# Patient Record
Sex: Male | Born: 1965 | Race: Black or African American | Hispanic: No | Marital: Married | Smoking: Current every day smoker
Health system: Southern US, Community
[De-identification: ages and names within clinical notes are randomized; demographics above are authoritative.]

## PROBLEM LIST (undated history)

## (undated) DIAGNOSIS — I10 Essential (primary) hypertension: Secondary | ICD-10-CM

## (undated) DIAGNOSIS — K4090 Unilateral inguinal hernia, without obstruction or gangrene, not specified as recurrent: Secondary | ICD-10-CM

## (undated) HISTORY — PX: COLONOSCOPY: SHX174

---

## 1999-07-22 ENCOUNTER — Encounter: Payer: Self-pay | Admitting: Emergency Medicine

## 1999-07-22 ENCOUNTER — Emergency Department (HOSPITAL_COMMUNITY): Admission: EM | Admit: 1999-07-22 | Discharge: 1999-07-22 | Payer: Self-pay | Admitting: Emergency Medicine

## 2006-02-25 ENCOUNTER — Emergency Department (HOSPITAL_COMMUNITY): Admission: EM | Admit: 2006-02-25 | Discharge: 2006-02-25 | Payer: Self-pay | Admitting: Emergency Medicine

## 2006-04-19 ENCOUNTER — Ambulatory Visit: Payer: Self-pay | Admitting: Gastroenterology

## 2006-06-10 ENCOUNTER — Ambulatory Visit (HOSPITAL_COMMUNITY): Admission: RE | Admit: 2006-06-10 | Discharge: 2006-06-10 | Payer: Self-pay | Admitting: Gastroenterology

## 2006-06-10 ENCOUNTER — Encounter (INDEPENDENT_AMBULATORY_CARE_PROVIDER_SITE_OTHER): Payer: Self-pay | Admitting: Specialist

## 2006-10-27 ENCOUNTER — Ambulatory Visit: Payer: Self-pay | Admitting: Gastroenterology

## 2007-12-17 IMAGING — CT CT CHEST W/O CM
2 of 9 series · 13 of 38 positions shown, 16 images · IV contrast (CONTRAST)
Comparison: NONE

CLINICAL DATA: Chronic cough. 

CT CHEST WITH INTRAVENOUS CONTRAST
TECHNIQUE: Multiple axial slices were obtained from the lung 
apex through the upper abdomen.  75 cc of Optiray 350 was injected 
at a rate of 3 cc per second.  Lung, soft tissue, and bone window 
settings were obtained.

[Series 18: arterial 2x2 · axial · arterial · 0.68mm/px · z∈[+1313,+1585]mm · 12 of 164 slices shown, 15 images]
[im 14/164  mediastinal]
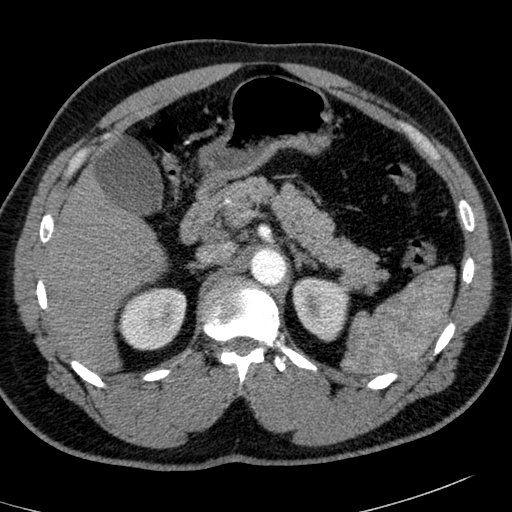
[im 14/164  lung]
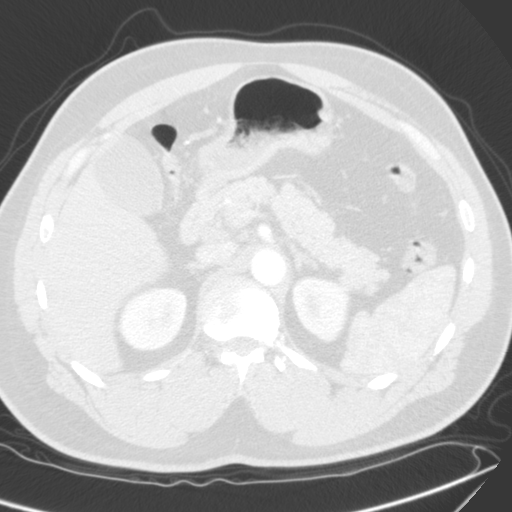
[im 28/164  lung]
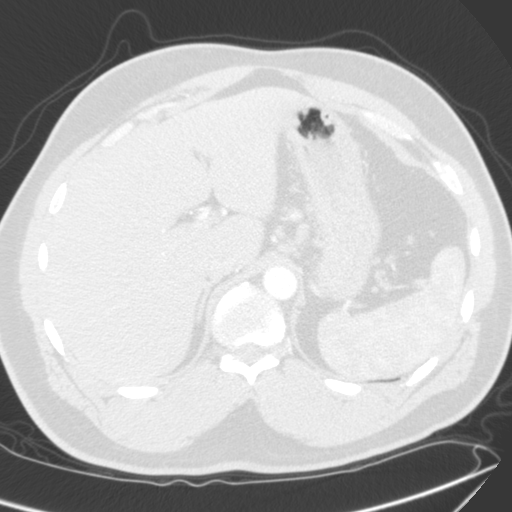
[im 41/164  lung]
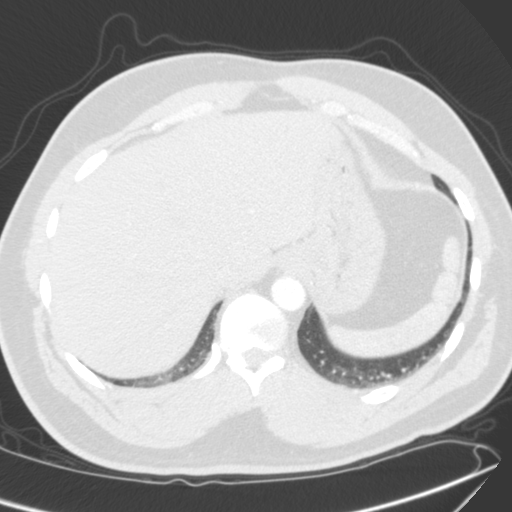
[im 55/164  lung]
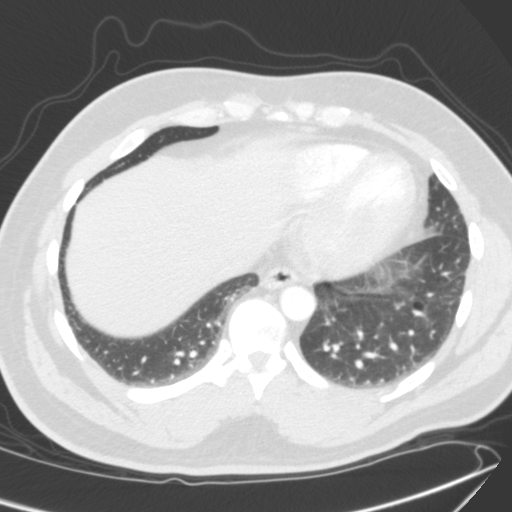
[im 68/164  mediastinal]
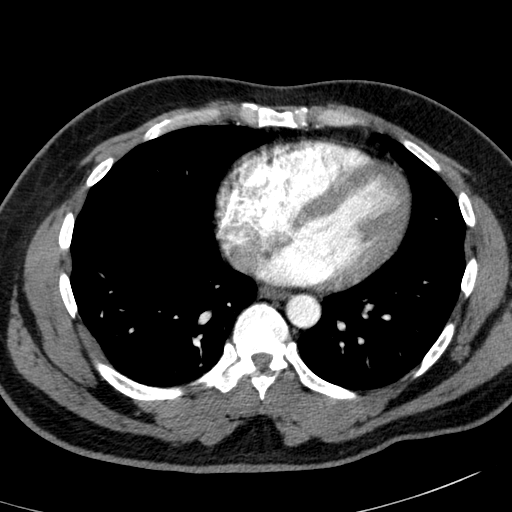
[im 68/164  lung]
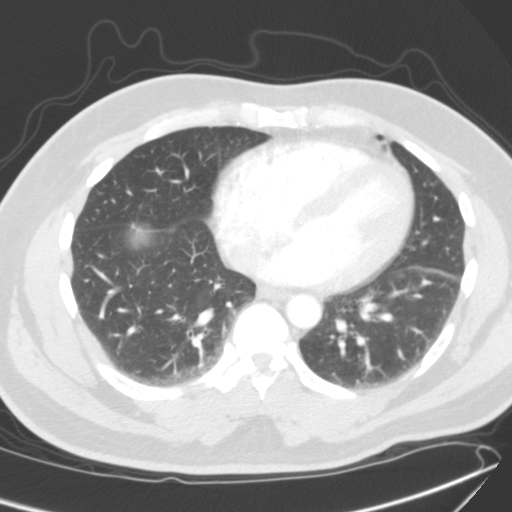
[im 81/164  lung]
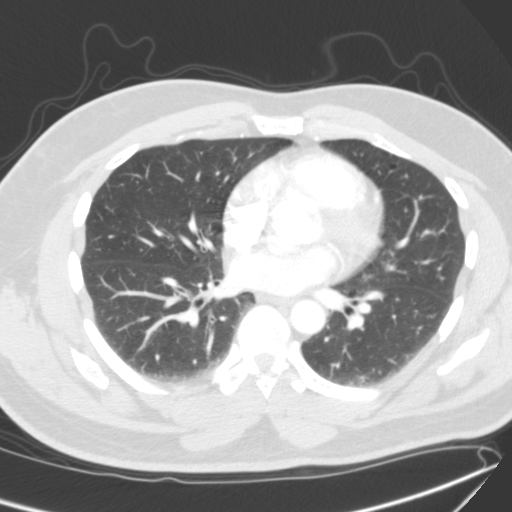
[im 82/164  lung]
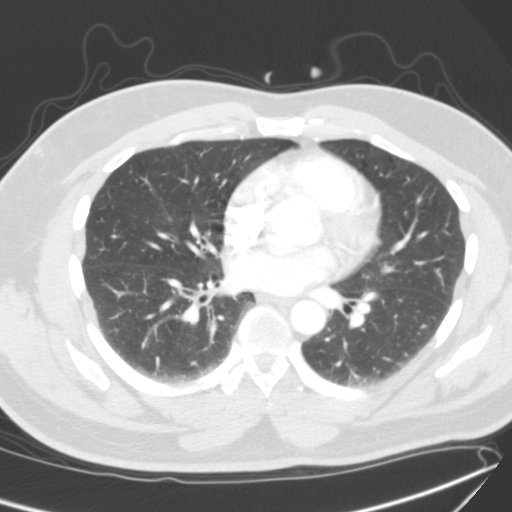
[im 96/164  lung]
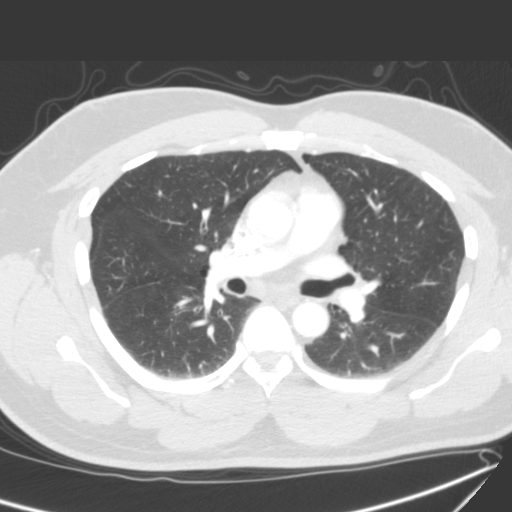
[im 109/164  mediastinal]
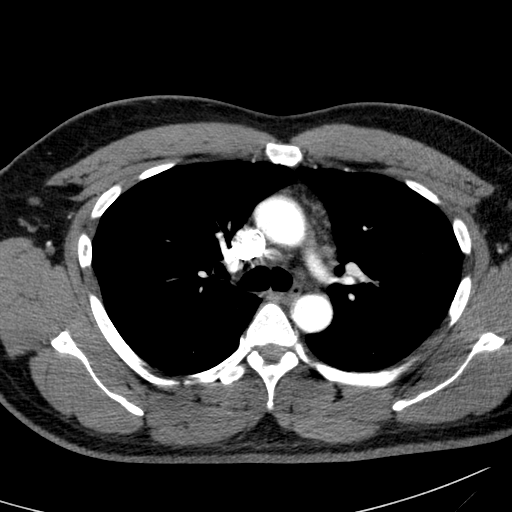
[im 109/164  lung]
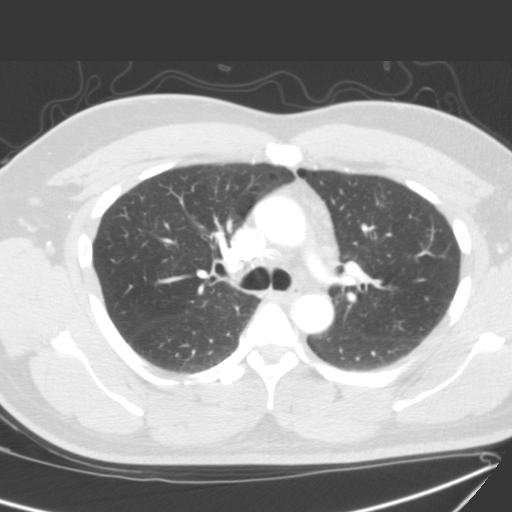
[im 123/164  lung]
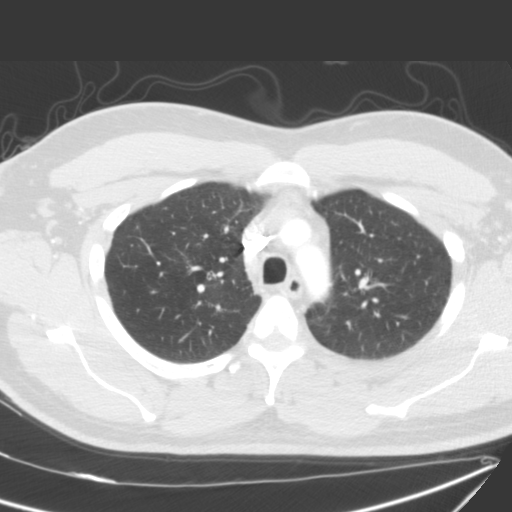
[im 136/164  lung]
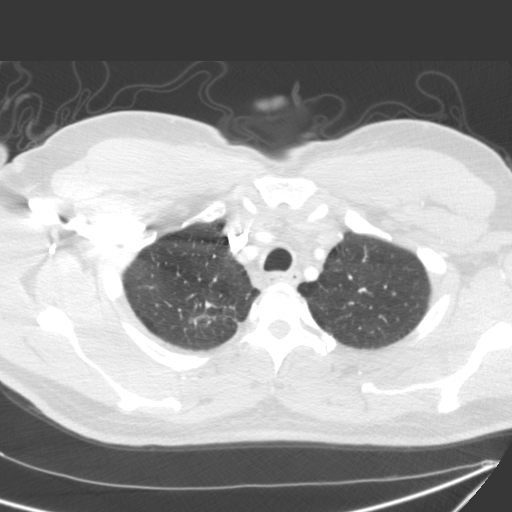
[im 150/164  lung]
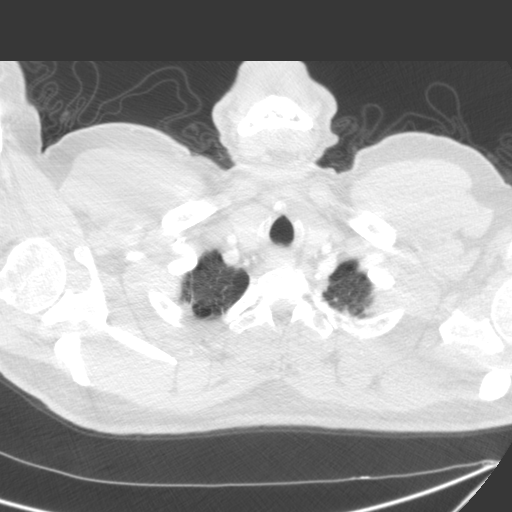

[rpa · coronal · 0.68mm/px · 1 of 121 slices shown]
[im 61/121  lung]
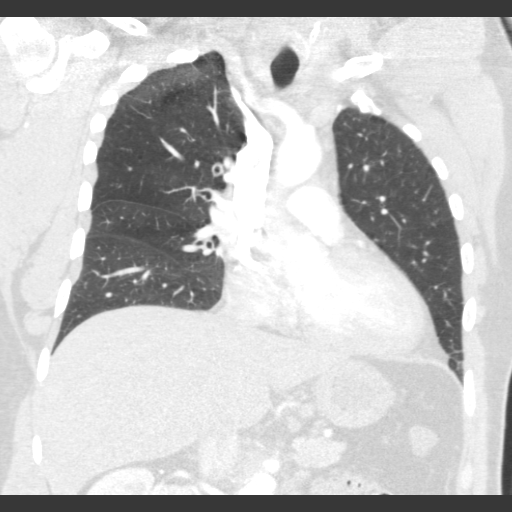

[13 of 38 positions shown; findings below may reference images not displayed]

FINDINGS: The heart size appears to be within normal limits.  
There is an enlarged node seen in the aortopulmonary window region 
that measures 1.3 cm in length.  There is also an enlarged node in 
the precarinal area that measures 1.6 cm in length.  There is 
fullness in the subcarinal region, but no discrete nodes.  No 
pleural effusions or pneumothorax.  There is a bulla seen in the 
left lower lobe.  No focal lytic or sclerotic osseous 
abnormalities.  No evidence of pulmonary masses.
IMPRESSION: Nonspecific mediastinal lymphadenopathy. No pulmonary 
04/15/2006 Dict Date: 04/15/2006  Tran Date: 04/15/2006 DAS  [id number]

## 2010-04-19 ENCOUNTER — Encounter: Payer: Self-pay | Admitting: Gastroenterology

## 2013-12-25 ENCOUNTER — Emergency Department (HOSPITAL_COMMUNITY): Payer: PRIVATE HEALTH INSURANCE

## 2013-12-25 ENCOUNTER — Encounter (HOSPITAL_COMMUNITY): Payer: Self-pay | Admitting: Emergency Medicine

## 2013-12-25 ENCOUNTER — Emergency Department (HOSPITAL_COMMUNITY)
Admission: EM | Admit: 2013-12-25 | Discharge: 2013-12-25 | Discharge status: Against Medical Advice | Payer: PRIVATE HEALTH INSURANCE | Attending: Emergency Medicine | Admitting: Emergency Medicine

## 2013-12-25 DIAGNOSIS — R42 Dizziness and giddiness: Secondary | ICD-10-CM | POA: Insufficient documentation

## 2013-12-25 DIAGNOSIS — R059 Cough, unspecified: Secondary | ICD-10-CM | POA: Diagnosis not present

## 2013-12-25 DIAGNOSIS — I10 Essential (primary) hypertension: Secondary | ICD-10-CM | POA: Diagnosis not present

## 2013-12-25 DIAGNOSIS — H9209 Otalgia, unspecified ear: Secondary | ICD-10-CM | POA: Insufficient documentation

## 2013-12-25 DIAGNOSIS — F17200 Nicotine dependence, unspecified, uncomplicated: Secondary | ICD-10-CM | POA: Diagnosis not present

## 2013-12-25 DIAGNOSIS — R05 Cough: Secondary | ICD-10-CM | POA: Diagnosis not present

## 2013-12-25 HISTORY — DX: Essential (primary) hypertension: I10

## 2013-12-25 NOTE — ED Notes (Signed)
Pt c/o left ear pain and some dizziness with sudden movement; pt sts cough x weeks

## 2014-02-07 ENCOUNTER — Ambulatory Visit (HOSPITAL_BASED_OUTPATIENT_CLINIC_OR_DEPARTMENT_OTHER): Payer: PRIVATE HEALTH INSURANCE

## 2014-04-24 ENCOUNTER — Encounter (HOSPITAL_COMMUNITY): Payer: Self-pay | Admitting: *Deleted

## 2014-04-24 ENCOUNTER — Emergency Department (HOSPITAL_COMMUNITY): Payer: PRIVATE HEALTH INSURANCE

## 2014-04-24 ENCOUNTER — Emergency Department (HOSPITAL_COMMUNITY)
Admission: EM | Admit: 2014-04-24 | Discharge: 2014-04-24 | Disposition: A | Discharge status: Home / Self Care | Payer: PRIVATE HEALTH INSURANCE | Attending: Emergency Medicine | Admitting: Emergency Medicine

## 2014-04-24 DIAGNOSIS — I159 Secondary hypertension, unspecified: Secondary | ICD-10-CM | POA: Diagnosis not present

## 2014-04-24 DIAGNOSIS — Z91199 Patient's noncompliance with other medical treatment and regimen due to unspecified reason: Secondary | ICD-10-CM

## 2014-04-24 DIAGNOSIS — Z9114 Patient's other noncompliance with medication regimen: Secondary | ICD-10-CM | POA: Insufficient documentation

## 2014-04-24 DIAGNOSIS — Z720 Tobacco use: Secondary | ICD-10-CM | POA: Diagnosis not present

## 2014-04-24 DIAGNOSIS — R42 Dizziness and giddiness: Secondary | ICD-10-CM

## 2014-04-24 DIAGNOSIS — Z9119 Patient's noncompliance with other medical treatment and regimen: Secondary | ICD-10-CM

## 2014-04-24 DIAGNOSIS — R799 Abnormal finding of blood chemistry, unspecified: Secondary | ICD-10-CM | POA: Diagnosis not present

## 2014-04-24 LAB — CBC WITH DIFFERENTIAL/PLATELET
BASOS ABS: 0 10*3/uL (ref 0.0–0.1)
BASOS PCT: 0 % (ref 0–1)
Eosinophils Absolute: 0.1 10*3/uL (ref 0.0–0.7)
Eosinophils Relative: 2 % (ref 0–5)
HCT: 46.1 % (ref 39.0–52.0)
HEMOGLOBIN: 15.9 g/dL (ref 13.0–17.0)
LYMPHS ABS: 1.8 10*3/uL (ref 0.7–4.0)
Lymphocytes Relative: 30 % (ref 12–46)
MCH: 31.1 pg (ref 26.0–34.0)
MCHC: 34.5 g/dL (ref 30.0–36.0)
MCV: 90.2 fL (ref 78.0–100.0)
MONO ABS: 0.5 10*3/uL (ref 0.1–1.0)
Monocytes Relative: 9 % (ref 3–12)
Neutro Abs: 3.4 10*3/uL (ref 1.7–7.7)
Neutrophils Relative %: 59 % (ref 43–77)
Platelets: 295 10*3/uL (ref 150–400)
RBC: 5.11 MIL/uL (ref 4.22–5.81)
RDW: 13 % (ref 11.5–15.5)
WBC: 5.8 10*3/uL (ref 4.0–10.5)

## 2014-04-24 LAB — URINALYSIS, ROUTINE W REFLEX MICROSCOPIC
BILIRUBIN URINE: NEGATIVE
GLUCOSE, UA: NEGATIVE mg/dL
Hgb urine dipstick: NEGATIVE
KETONES UR: NEGATIVE mg/dL
LEUKOCYTES UA: NEGATIVE
Nitrite: NEGATIVE
PROTEIN: NEGATIVE mg/dL
Specific Gravity, Urine: 1.014 (ref 1.005–1.030)
UROBILINOGEN UA: 0.2 mg/dL (ref 0.0–1.0)
pH: 5.5 (ref 5.0–8.0)

## 2014-04-24 LAB — COMPREHENSIVE METABOLIC PANEL
ALBUMIN: 3.9 g/dL (ref 3.5–5.2)
ALT: 43 U/L (ref 0–53)
ANION GAP: 9 (ref 5–15)
AST: 47 U/L — ABNORMAL HIGH (ref 0–37)
Alkaline Phosphatase: 81 U/L (ref 39–117)
BUN: 15 mg/dL (ref 6–23)
CHLORIDE: 104 mmol/L (ref 96–112)
CO2: 25 mmol/L (ref 19–32)
CREATININE: 1.4 mg/dL — AB (ref 0.50–1.35)
Calcium: 8.8 mg/dL (ref 8.4–10.5)
GFR calc Af Amer: 68 mL/min — ABNORMAL LOW (ref >90)
GFR calc non Af Amer: 59 mL/min — ABNORMAL LOW (ref >90)
GLUCOSE: 80 mg/dL (ref 70–99)
Potassium: 4.3 mmol/L (ref 3.5–5.1)
SODIUM: 138 mmol/L (ref 135–145)
Total Bilirubin: 0.7 mg/dL (ref 0.3–1.2)
Total Protein: 6.8 g/dL (ref 6.0–8.3)

## 2014-04-24 LAB — RAPID URINE DRUG SCREEN, HOSP PERFORMED
AMPHETAMINES: NOT DETECTED
BENZODIAZEPINES: NOT DETECTED
Barbiturates: NOT DETECTED
Cocaine: NOT DETECTED
Opiates: NOT DETECTED
TETRAHYDROCANNABINOL: POSITIVE — AB

## 2014-04-24 LAB — I-STAT TROPONIN, ED: Troponin i, poc: 0 ng/mL (ref 0.00–0.08)

## 2014-04-24 LAB — TROPONIN I: Troponin I: <0.03 ng/mL (ref <0.031)

## 2014-04-24 MED ORDER — AMLODIPINE BESYLATE 5 MG PO TABS
10.0000 mg | ORAL_TABLET | Freq: Once | ORAL | Status: DC
  Filled 2014-04-24: qty 2

## 2014-04-24 MED ORDER — AMLODIPINE BESYLATE 5 MG PO TABS: 5.0000 mg | ORAL_TABLET | Freq: Every day | ORAL | Status: DC

## 2014-04-24 MED ORDER — AMLODIPINE BESYLATE 10 MG PO TABS: 10.0000 mg | ORAL_TABLET | Freq: Every day | ORAL | Status: DC

## 2014-04-24 MED ORDER — AMLODIPINE BESYLATE 5 MG PO TABS
5.0000 mg | ORAL_TABLET | Freq: Once | ORAL | Status: AC
  Administered 2014-04-24: 5 mg via ORAL

## 2014-04-24 NOTE — Discharge Instructions (Signed)
Please call your doctor for a followup appointment within 24-48 hours. When you talk to your doctor please let them know that you were seen in the emergency department and have them acquire all of your records so that they can discuss the findings with you and formulate a treatment plan to fully care for your new and ongoing problems. Please call and set up an appointment with your primary care provider to be seen and reassessed this week Blood pressure will need to be rechecked with an approximately 48 hours Creatinine level, functioning of your kidneys, needs to be reassessed later this week Please rest and stay hydrated Please avoid any food high in salt or grease Please continue to monitor symptoms closely and if symptoms are to worsen or change (fever greater than 101, chills, sweating, nausea, vomiting, chest pain, shortness of breathe, difficulty breathing, weakness, numbness, tingling, worsening or changes to pain pattern, blurred vision, sudden loss of vision, weakness, weakness to one side of the body, fall, head injury, worsening or changes to symptoms, sudden onset of worst headache of life) please report back to the Emergency Department immediately.    Dizziness Dizziness is a common problem. It is a feeling of unsteadiness or light-headedness. You may feel like you are about to faint. Dizziness can lead to injury if you stumble or fall. A person of any age group can suffer from dizziness, but dizziness is more common in older adults. CAUSES  Dizziness can be caused by many different things, including:  Middle ear problems.  Standing for too long.  Infections.  An allergic reaction.  Aging.  An emotional response to something, such as the sight of blood.  Side effects of medicines.  Tiredness.  Problems with circulation or blood pressure.  Excessive use of alcohol or medicines, or illegal drug use.  Breathing too fast (hyperventilation).  An irregular heart rhythm  (arrhythmia).  A low red blood cell count (anemia).  Pregnancy.  Vomiting, diarrhea, fever, or other illnesses that cause body fluid loss (dehydration).  Diseases or conditions such as Parkinson's disease, high blood pressure (hypertension), diabetes, and thyroid problems.  Exposure to extreme heat. DIAGNOSIS  Your health care provider will ask about your symptoms, perform a physical exam, and perform an electrocardiogram (ECG) to record the electrical activity of your heart. Your health care provider may also perform other heart or blood tests to determine the cause of your dizziness. These may include:  Transthoracic echocardiogram (TTE). During echocardiography, sound waves are used to evaluate how blood flows through your heart.  Transesophageal echocardiogram (TEE).  Cardiac monitoring. This allows your health care provider to monitor your heart rate and rhythm in real time.  Holter monitor. This is a portable device that records your heartbeat and can help diagnose heart arrhythmias. It allows your health care provider to track your heart activity for several days if needed.  Stress tests by exercise or by giving medicine that makes the heart beat faster. TREATMENT  Treatment of dizziness depends on the cause of your symptoms and can vary greatly. HOME CARE INSTRUCTIONS   Drink enough fluids to keep your urine clear or pale yellow. This is especially important in very hot weather. In older adults, it is also important in cold weather.  Take your medicine exactly as directed if your dizziness is caused by medicines. When taking blood pressure medicines, it is especially important to get up slowly.  Rise slowly from chairs and steady yourself until you feel okay.  In the  morning, first sit up on the side of the bed. When you feel okay, stand slowly while holding onto something until you know your balance is fine.  Move your legs often if you need to stand in one place for a  long time. Tighten and relax your muscles in your legs while standing.  Have someone stay with you for 1-2 days if dizziness continues to be a problem. Do this until you feel you are well enough to stay alone. Have the person call your health care provider if he or she notices changes in you that are concerning.  Do not drive or use heavy machinery if you feel dizzy.  Do not drink alcohol. SEEK IMMEDIATE MEDICAL CARE IF:   Your dizziness or light-headedness gets worse.  You feel nauseous or vomit.  You have problems talking, walking, or using your arms, hands, or legs.  You feel weak.  You are not thinking clearly or you have trouble forming sentences. It may take a friend or family member to notice this.  You have chest pain, abdominal pain, shortness of breath, or sweating.  Your vision changes.  You notice any bleeding.  You have side effects from medicine that seems to be getting worse rather than better. MAKE SURE YOU:   Understand these instructions.  Will watch your condition.  Will get help right away if you are not doing well or get worse. Document Released: 09/08/2000 Document Revised: 03/20/2013 Document Reviewed: 10/02/2010 [facility] Patient Information 2015 [place], [place]. This information is not intended to replace advice given to you by your health care provider. Make sure you discuss any questions you have with your health care provider. DASH Eating Plan DASH stands for "Dietary Approaches to Stop Hypertension." The DASH eating plan is a healthy eating plan that has been shown to reduce high blood pressure (hypertension). Additional health benefits may include reducing the risk of type 2 diabetes mellitus, heart disease, and stroke. The DASH eating plan may also help with weight loss. WHAT DO I NEED TO KNOW ABOUT THE DASH EATING PLAN? For the DASH eating plan, you will follow these general guidelines:  Choose foods with a percent daily value for sodium of less  than 5% (as listed on the food label).  Use salt-free seasonings or herbs instead of table salt or sea salt.  Check with your health care provider or pharmacist before using salt substitutes.  Eat lower-sodium products, often labeled as "lower sodium" or "no salt added."  Eat fresh foods.  Eat more vegetables, fruits, and low-fat dairy products.  Choose whole grains. Look for the word "whole" as the first word in the ingredient list.  Choose fish and skinless chicken or [place] more often than red meat. Limit fish, poultry, and meat to 6 oz (170 g) each day.  Limit sweets, desserts, sugars, and sugary drinks.  Choose heart-healthy fats.  Limit cheese to 1 oz (28 g) per day.  Eat more home-cooked food and less restaurant, buffet, and fast food.  Limit fried foods.  Cook foods using methods other than frying.  Limit canned vegetables. If you do use them, rinse them well to decrease the sodium.  When eating at a restaurant, ask that your food be prepared with less salt, or no salt if possible. WHAT FOODS CAN I EAT? Seek help from a dietitian for individual calorie needs. Grains Whole grain or whole wheat bread. Brown rice. Whole grain or whole wheat pasta. Quinoa, bulgur, and whole grain cereals. Low-sodium cereals.  Corn or whole wheat flour tortillas. Whole grain cornbread. Whole grain crackers. Low-sodium crackers. Vegetables Fresh or frozen vegetables (raw, steamed, roasted, or grilled). Low-sodium or reduced-sodium tomato and vegetable juices. Low-sodium or reduced-sodium tomato sauce and paste. Low-sodium or reduced-sodium canned vegetables.  Fruits All fresh, canned (in natural juice), or frozen fruits. Meat and Other Protein Products Ground beef (85% or leaner), grass-fed beef, or beef trimmed of fat. Skinless chicken or [place]. Ground chicken or [place]. Pork trimmed of fat. All fish and seafood. Eggs. Dried beans, peas, or lentils. Unsalted nuts and seeds. Unsalted canned  beans. Dairy Low-fat dairy products, such as skim or 1% milk, 2% or reduced-fat cheeses, low-fat ricotta or cottage cheese, or plain low-fat yogurt. Low-sodium or reduced-sodium cheeses. Fats and Oils Tub margarines without trans fats. Light or reduced-fat mayonnaise and salad dressings (reduced sodium). Avocado. Safflower, olive, or canola oils. Natural peanut or almond butter. Other Unsalted popcorn and pretzels. The items listed above may not be a complete list of recommended foods or beverages. Contact your dietitian for more options. WHAT FOODS ARE NOT RECOMMENDED? Grains White bread. White pasta. White rice. Refined cornbread. Bagels and croissants. Crackers that contain trans fat. Vegetables Creamed or fried vegetables. Vegetables in a cheese sauce. Regular canned vegetables. Regular canned tomato sauce and paste. Regular tomato and vegetable juices. Fruits Dried fruits. Canned fruit in light or heavy syrup. Fruit juice. Meat and Other Protein Products Fatty cuts of meat. Ribs, chicken wings, bacon, sausage, bologna, salami, chitterlings, fatback, hot dogs, bratwurst, and packaged luncheon meats. Salted nuts and seeds. Canned beans with salt. Dairy Whole or 2% milk, cream, half-and-half, and cream cheese. Whole-fat or sweetened yogurt. Full-fat cheeses or blue cheese. Nondairy creamers and whipped toppings. Processed cheese, cheese spreads, or cheese curds. Condiments Onion and garlic salt, seasoned salt, table salt, and sea salt. Canned and packaged gravies. Worcestershire sauce. Tartar sauce. Barbecue sauce. Teriyaki sauce. Soy sauce, including reduced sodium. Steak sauce. Fish sauce. Oyster sauce. Cocktail sauce. Horseradish. Ketchup and mustard. Meat flavorings and tenderizers. Bouillon cubes. Hot sauce. Tabasco sauce. Marinades. Taco seasonings. Relishes. Fats and Oils Butter, stick margarine, lard, shortening, ghee, and bacon fat. Coconut, palm kernel, or palm oils. Regular salad  dressings. Other Pickles and olives. Salted popcorn and pretzels. The items listed above may not be a complete list of foods and beverages to avoid. Contact your dietitian for more information. WHERE CAN I FIND MORE INFORMATION? National Heart, Lung, and Blood Institute: [employer] Document Released: 03/04/2011 Document Revised: 07/30/2013 Document Reviewed: 01/17/2013 [facility] Patient Information 2015 [place], [place]. This information is not intended to replace advice given to you by your health care provider. Make sure you discuss any questions you have with your health care provider. Hypertension Hypertension, commonly called high blood pressure, is when the force of blood pumping through your arteries is too strong. Your arteries are the blood vessels that carry blood from your heart throughout your body. A blood pressure reading consists of a higher number over a lower number, such as 110/72. The higher number (systolic) is the pressure inside your arteries when your heart pumps. The lower number (diastolic) is the pressure inside your arteries when your heart relaxes. Ideally you want your blood pressure below 120/80. Hypertension forces your heart to work harder to pump blood. Your arteries may become narrow or stiff. Having hypertension puts you at risk for heart disease, stroke, and other problems.  RISK FACTORS Some risk factors for high blood pressure are controllable. Others are not.  Risk factors you cannot control include:   Race. You may be at higher risk if you are African American.  Age. Risk increases with age.  Gender. Men are at higher risk than women before age 34 years. After age 13, women are at higher risk than men. Risk factors you can control include:  Not getting enough exercise or physical activity.  Being overweight.  Getting too much fat, sugar, calories, or salt in your diet.  Drinking too much alcohol. SIGNS AND  SYMPTOMS Hypertension does not usually cause signs or symptoms. Extremely high blood pressure (hypertensive crisis) may cause headache, anxiety, shortness of breath, and nosebleed. DIAGNOSIS  To check if you have hypertension, your health care provider will measure your blood pressure while you are seated, with your arm held at the level of your heart. It should be measured at least twice using the same arm. Certain conditions can cause a difference in blood pressure between your right and left arms. A blood pressure reading that is higher than normal on one occasion does not mean that you need treatment. If one blood pressure reading is high, ask your health care provider about having it checked again. TREATMENT  Treating high blood pressure includes making lifestyle changes and possibly taking medicine. Living a healthy lifestyle can help lower high blood pressure. You may need to change some of your habits. Lifestyle changes may include:  Following the DASH diet. This diet is high in fruits, vegetables, and whole grains. It is low in salt, red meat, and added sugars.  Getting at least 2 hours of brisk physical activity every week.  Losing weight if necessary.  Not smoking.  Limiting alcoholic beverages.  Learning ways to reduce stress. If lifestyle changes are not enough to get your blood pressure under control, your health care provider may prescribe medicine. You may need to take more than one. Work closely with your health care provider to understand the risks and benefits. HOME CARE INSTRUCTIONS  Have your blood pressure rechecked as directed by your health care provider.   Take medicines only as directed by your health care provider. Follow the directions carefully. Blood pressure medicines must be taken as prescribed. The medicine does not work as well when you skip doses. Skipping doses also puts you at risk for problems.   Do not smoke.   Monitor your blood pressure at  home as directed by your health care provider. SEEK MEDICAL CARE IF:   You think you are having a reaction to medicines taken.  You have recurrent headaches or feel dizzy.  You have swelling in your ankles.  You have trouble with your vision. SEEK IMMEDIATE MEDICAL CARE IF:  You develop a severe headache or confusion.  You have unusual weakness, numbness, or feel faint.  You have severe chest or abdominal pain.  You vomit repeatedly.  You have trouble breathing. MAKE SURE YOU:   Understand these instructions.  Will watch your condition.  Will get help right away if you are not doing well or get worse. Document Released: 03/15/2005 Document Revised: 07/30/2013 Document Reviewed: 01/05/2013 [facility] Patient Information 2015 [place], [place]. This information is not intended to replace advice given to you by your health care provider. Make sure you discuss any questions you have with your health care provider.   Emergency Department Resource Guide 1) Find a Doctor and Pay Out of Pocket Although you won't have to find out who is covered by your insurance plan, it is a good idea  to ask around and get recommendations. You will then need to call the office and see if the doctor you have chosen will accept you as a new patient and what types of options they offer for patients who are self-pay. Some doctors offer discounts or will set up payment plans for their patients who do not have insurance, but you will need to ask so you aren't surprised when you get to your appointment.  2) Contact Your Local Health Department Not all health departments have doctors that can see patients for sick visits, but many do, so it is worth a call to see if yours does. If you don't know where your local health department is, you can check in your phone book. The CDC also has a tool to help you locate your state's health department, and many state websites also have listings of all of their local health  departments.  3) Find a Walk-in Clinic If your illness is not likely to be very severe or complicated, you may want to try a walk in clinic. These are popping up all over the country in pharmacies, drugstores, and shopping centers. They're usually staffed by nurse practitioners or physician assistants that have been trained to treat common illnesses and complaints. They're usually fairly quick and inexpensive. However, if you have serious medical issues or chronic medical problems, these are probably not your best option.  No Primary Care Doctor: - Call Health Connect at  [phone] - they can help you locate a primary care doctor that  accepts your insurance, provides certain services, etc. - Physician Referral Service- [phone]  Chronic Pain Problems: Organization         Address  Phone   Notes  [facility]  [phone] Patients need to be referred by their primary care doctor.   Medication Assistance: Organization         Address  Phone   Notes  [facility] Medication [facility] [address] [place] [place], [place] [address] [phone] --Must be a resident of [facility] -- Must have NO insurance coverage whatsoever (no Medicaid/ Medicare, etc.) -- The pt. MUST have a primary care doctor that directs their care regularly and follows them in the community   [facility]  [phone]   [employer]  [phone]    Agencies that provide inexpensive medical care: Organization         Address  Phone   Notes  [facility]  [phone]   [facility]    [phone]   [facility] [address] [place], [place] [address] [phone]   [facility] [address] [place], [place] [phone]   Planned Parenthood    [phone]   [facility]    [phone]   [facility]  [address], [place] Phone:  [phone], Fax:  [phone] Hours of Operation:  9 am - 6 pm, M-F.  Also accepts Medicaid/Medicare and self-pay.  [facility]  [address], [place] Phone: [phone], Fax: [phone]. Hours of Operation:  8:30 am - 5:30 pm, M-F.  Also accepts Medicaid and self-pay.  [facility] [place] [address] [place] Phone: [phone]   [facility] [address] Natasha Bence [place], [place] [phone], Ext. [phone] Mondays & Thursdays: 7-9 AM.  First 15 patients  are seen on a first come, first serve basis.    Medicaid-accepting [facility] Providers:  Organization         Address  Phone   Notes  [facility] [address], [place] [phone] Also accepts self-pay patients.  [facility] [address], [place]  [phone]) [phone]   [facility] [address], [place] [phone]) [phone]   [facility] [address] [place] [address], [place] [phone]) [phone]   Renaye RakersVeita Bland [address], [place]   [phone] Only accepts [place] Access [place] patients after they have their name applied to their card.   Self-Pay (no insurance) in [facility] [place]:  Organization         Address  Phone   Notes  Sickle Cell Patients, [facility] [address], [place] [phone]   [facility] [address], [place] [phone]) [phone]   [facility] [place]  [address], [place] [phone]   [facility]/Dr. Osei-Bonsu  [address] [place] [address], [place] or [id number], [place] [phone] Phone number for both [place] and [place] locations is the same.  [facility] [address], [place] [phone](336) [phone]   [facility] [place] [address] [place] [address], [place] or [address] [phone]  [phone]   [facility] [address], [place] [phone]) [phone], phone; [phone], fax Sees patients 1st and 3rd Saturday of every month.  Must not qualify for public or private insurance (i.e. Medicaid, Medicare, [place] Health Choice, Veterans' Benefits)  Household income should be no more than 200% of the poverty level The clinic cannot treat you if you are pregnant or think you are pregnant  Sexually transmitted diseases are not treated at the clinic.    Dental Care: Organization         Address  Phone  Notes  [facility] [address], [place] [phone]) [phone] Accepts children up to age 47 who are enrolled in [place] or [place] Health Choice; pregnant women with a Medicaid card; and children who have applied for Medicaid or [place] Health Choice, but were declined, whose parents can pay a reduced fee at time of service.  [facility] [place]  [address], [place] [phone] Accepts children up to age 47 who are enrolled in [place] or [place] Health Choice; pregnant women with a Medicaid card; and children who have applied for Medicaid or [place] Health Choice, but were declined, whose parents can pay a reduced fee at time of service.  [facility]  [address], [place] [phone] Patients are seen by appointment only. Walk-ins are not accepted. [facility] will see patients 47 years of age and older. Monday - Tuesday (8am-5pm) Most Wednesdays (8:30-5pm) $30 per visit, cash only  [facility]  [address], [facility] [phone] Patients are seen by appointment only. Walk-ins are not accepted. [facility] will see patients 47 years of age and older. One Wednesday Evening (Monthly: Volunteer Based).  $30 per visit, cash only  [employer] [facility]  [phone] for adults;  Children under age 384, call Graduate Pediatric Dentistry at [phone]. Children aged 50-14, please call [phone] to request a pediatric application.  Dental services are provided in all areas of dental care including fillings, crowns and bridges, complete and partial dentures, implants, gum treatment, root canals, and extractions. Preventive care is also provided. Treatment is provided to both adults and children. Patients are selected via a lottery and there is often a waiting list.   [facility] [address], [place]  [phone] [email or web address]   [facility] [address] [place], [place] [phone], Ext. [phone] Second and Fourth Thursday of each month, opens at 6:30 AM; Clinic ends at 9 AM.  Patients are seen on a first-come first-served basis, and a limited number are seen during each clinic.   [facility]  [address] Ether Griffins [place], [place] [phone]   Eligibility Requirements You must have lived in [place], [place], or [place] counties for at least the last three months.   You cannot be eligible for state or federal sponsored [place], including [employer], [place], or [employer].   You generally cannot be eligible for healthcare insurance through your employer.    How to apply: Eligibility screenings are held every Tuesday and Wednesday afternoon from 1:00 pm until 4:00 pm. You do not need an appointment for the interview!  [facility] [address] [place], [place] [phone]   [facility] Health Department  [phone]   [facility] Health Department  [phone]   [facility] Health Department  [phone]    Behavioral Health Resources in the Community: Intensive Outpatient Programs Organization         Address  Phone  Notes  [facility] [address], [place], [place] [phone]   [facility] Outpatient [address] [place], [place] [phone]   [facility] [address], [place], [place]  [phone]   [facility] [address]  [place], [place] [phone] or [phone]   Substance Abuse Resources Organization         Address  Phone  Notes  [facility]  [phone]   [facility]  [phone]   [facility]  [phone]   Floydene Flock  [phone]   Residential & Outpatient Substance Abuse Program  [phone]   Psychological Services Organization         Address  Phone  Notes  [facility] Health  [phone]   [facility] Services  [phone]   [facility] Mental Health [address] [place] [phone] or [phone]    Mobile Crisis Teams Organization         Address  Phone  Notes  [facility], Mobile Crisis Care Unit  [phone]   [facility]  [address] [place], [place] [phone]   Doristine Locks [address] [place] [phone]    Self-Help/Support Groups Organization         Address  Phone             Notes  [facility] [place] - variety of support groups  [phone] Call for more information  Narcotics Anonymous (NA), [facility] [address], [employer] [place]  2 meetings at this location   Chief Executive Officer  Notes  [facility] [address],    [place]  [phone]   [facility]  [address] [place] [address], [place], [place] [phone]   [facility] [address] [place], [place] [phone] Admissions: 8am-3pm M-F  [facility] [address]    [place], [place] [phone]   [facility] [address] [place], [place], [place] [phone]   [facility] [address]  [place], [place] [phone]   [facility] - Intensive  Outpatient [address], [place], [place] [phone]   [facility] [address] [place].,  [place], [place] [phone] or [phone]   [facility] ([facility] [address] [place], [place] [phone] Accepts Medicaid  [facility] [address]  [place] [phone] Substance Abuse/Addiction Treatment   [facility]         Address  Phone  Notes  [facility]  [phone]   Angie Fava, PhD [address] Ervin Knack [place], [place]   [phone] or [phone]   [facility] Behavioral   [address] [place], [place] [phone]   [facility] [address], [place], [place] [phone] Insurance/Medicaid/sponsorship through [facility] [address]                                    [place], [place] [phone] Therapy/tele-psych/case  [facility] [address], [place] [phone]    Dr. Lolly Mustache  [phone]   [facility]  [employer] [facility] Dept. 1) [address], [place] 2) [address] [place] 3)  [address], [place] [phone]  [phone]   [facility] Child Abuse Hotline [phone] or [phone] (After Hours)

## 2014-04-24 NOTE — ED Notes (Signed)
Pt d/c'd from IV, monitor, continuous pulse oximetry and blood pressure cuff; pt getting dressed to be discharged home 

## 2014-04-24 NOTE — ED Notes (Addendum)
Patient states he has had persistent dizziness.  He states he notice more when he changes positions and moves his head.  He denies chest pain.  Denies numbness.  Patient denies any syncope.  Patient denies any n/v/d.  [facility].  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

## 2014-04-24 NOTE — ED Provider Notes (Signed)
CSN: [id number]     Arrival date & time 04/24/14  0845 History   First MD Initiated Contact with Patient 04/24/14 [phone]     Chief Complaint  Patient presents with  . Dizziness     (Consider location/radiation/quality/duration/timing/severity/associated sxs/prior Treatment) The history is provided by the patient. No language interpreter was used.  John Barron is a 49 y/o M with PMHx of HTN presenting to the ED with dizziness that has been ongoing for the past 3 months. Patient reported that over the past month he has been having increase dizziness - stated that the episodes occur more frequently. Patient reported that when he turns his head, gets up quickly, and picks up something he experiences dizziness that lasts a couple of minutes. Patient reported that he has not been seen by a physician yet - reported that he is [facility], but has not seen them in over 8 months. Patient reported that he does have history of HTN - stated that he has not been taking his medications in many months. Denied falls, head injury, chest pain, shortness of breath, difficulty breathing, nausea, vomiting, blurred vision, sudden loss of vision, neck pain, neck stiffness, fainting, confusion, disorientation, numbness, tingling, weakness, changes to medications, headache, fever, chills, diaphoresis. PCP [facility]  Past Medical History  Diagnosis Date  . Hypertension    History reviewed. No pertinent past surgical history. No family history on file. History  Substance Use Topics  . Smoking status: Current Every Day Smoker  . Smokeless tobacco: Not on file  . Alcohol Use: Yes    Review of Systems  Constitutional: Negative for fever and chills.  Eyes: Negative for visual disturbance.  Respiratory: Negative for chest tightness and shortness of breath.   Cardiovascular: Negative for chest pain.  Gastrointestinal: Negative for nausea and vomiting.  Musculoskeletal: Negative  for back pain, neck pain and neck stiffness.  Neurological: Positive for dizziness. Negative for syncope, weakness, numbness and headaches.      Allergies  Review of patient's allergies indicates no known allergies.  Home Medications   Prior to Admission medications   Medication Sig Start Date End Date Taking? Authorizing Provider  amLODipine (NORVASC) 5 MG tablet Take 1 tablet (5 mg total) by mouth daily. 04/24/14   Tyrina Hines, PA-C  amLODipine-olmesartan (AZOR) 10-40 MG per tablet Take 1 tablet by mouth daily. Ordered this in August - has not refilled RX since then    Historical Provider, MD   BP 182/113 mmHg  Pulse 61  Temp(Src) 98.6 F (37 C) (Oral)  Resp 14  Ht  (1.702 m)  Wt 210 lb (95.255 kg)  BMI 32.88 kg/m2  SpO2 100% Physical Exam  Constitutional: He is oriented to person, place, and time. He appears well-developed and well-nourished. No distress.  HENT:  Head: Normocephalic and atraumatic.  Mouth/Throat: Oropharynx is clear and moist. No oropharyngeal exudate.  Eyes: Conjunctivae and EOM are normal. Pupils are equal, round, and reactive to light. Right eye exhibits no discharge. Left eye exhibits no discharge.  EOMs intact without signs of entrapment Horizontal nystagmus identified but is resolvable with time  Neck: Normal range of motion. Neck supple. No tracheal deviation present.  Cardiovascular: Normal rate, regular rhythm and normal heart sounds.  Exam reveals no friction rub.   No murmur heard. Pulses:      Radial pulses are 2+ on the right side, and 2+ on the left side.       Dorsalis pedis  pulses are 2+ on the right side, and 2+ on the left side.  Cap refill less than 3 seconds Negative swelling or pitting edema identified to lower extremities bilaterally  Pulmonary/Chest: Effort normal and breath sounds normal. No respiratory distress. He has no wheezes. He has no rales.  Patient speaks in full sentences without difficulty Negative use of excess  her muscles Negative stridor  Musculoskeletal: Normal range of motion.  Full ROM to upper and lower extremities without difficulty noted, negative ataxia noted.  Lymphadenopathy:    He has no cervical adenopathy.  Neurological: He is alert and oriented to person, place, and time. No cranial nerve deficit. He exhibits normal muscle tone. Coordination normal.  Cranial nerves III-XII grossly intact Strength 5+/5+ to upper and lower extremities bilaterally with resistance applied, equal distribution noted Equal grip strength bilaterally Negative facial droop Negative slurred speech Negative aphasia Patient is able to bring finger to nose bilaterally without difficulty or ataxia Interphalangeal strength intact Negative arm drift Fine motor skills intact Heel to knee down shin normal bilaterally  Skin: Skin is warm and dry. No rash noted. He is not diaphoretic. No erythema.  Psychiatric: He has a normal mood and affect. His behavior is normal. Thought content normal.  Nursing note and vitals reviewed.   ED Course  Procedures (including critical care time)  Results for orders placed or performed during the hospital encounter of 04/24/14  Troponin I  Result Value Ref Range   Troponin I <0.03 <0.031 ng/mL  CBC with Differential/Platelet  Result Value Ref Range   WBC 5.8 4.0 - 10.5 K/uL   RBC 5.11 4.22 - 5.81 MIL/uL   Hemoglobin 15.9 13.0 - 17.0 g/dL   HCT 16.1 09.6 - 04.5 %   MCV 90.2 78.0 - 100.0 fL   MCH 31.1 26.0 - 34.0 pg   MCHC 34.5 30.0 - 36.0 g/dL   RDW 40.9 81.1 - 91.4 %   Platelets 295 150 - 400 K/uL   Neutrophils Relative % 59 43 - 77 %   Neutro Abs 3.4 1.7 - 7.7 K/uL   Lymphocytes Relative 30 12 - 46 %   Lymphs Abs 1.8 0.7 - 4.0 K/uL   Monocytes Relative 9 3 - 12 %   Monocytes Absolute 0.5 0.1 - 1.0 K/uL   Eosinophils Relative 2 0 - 5 %   Eosinophils Absolute 0.1 0.0 - 0.7 K/uL   Basophils Relative 0 0 - 1 %   Basophils Absolute 0.0 0.0 - 0.1 K/uL  Comprehensive  metabolic panel  Result Value Ref Range   Sodium 138 135 - 145 mmol/L   Potassium 4.3 3.5 - 5.1 mmol/L   Chloride 104 96 - 112 mmol/L   CO2 25 19 - 32 mmol/L   Glucose, Bld 80 70 - 99 mg/dL   BUN 15 6 - 23 mg/dL   Creatinine, Ser 7.82 (H) 0.50 - 1.35 mg/dL   Calcium 8.8 8.4 - 95.6 mg/dL   Total Protein 6.8 6.0 - 8.3 g/dL   Albumin 3.9 3.5 - 5.2 g/dL   AST 47 (H) 0 - 37 U/L   ALT 43 0 - 53 U/L   Alkaline Phosphatase 81 39 - 117 U/L   Total Bilirubin 0.7 0.3 - 1.2 mg/dL   GFR calc non Af Amer 59 (L) >90 mL/min   GFR calc Af Amer 68 (L) >90 mL/min   Anion gap 9 5 - 15  Urinalysis, Routine w reflex microscopic  Result Value Ref Range  Color, Urine YELLOW YELLOW   APPearance CLEAR CLEAR   Specific Gravity, Urine 1.014 1.005 - 1.030   pH 5.5 5.0 - 8.0   Glucose, UA NEGATIVE NEGATIVE mg/dL   Hgb urine dipstick NEGATIVE NEGATIVE   Bilirubin Urine NEGATIVE NEGATIVE   Ketones, ur NEGATIVE NEGATIVE mg/dL   Protein, ur NEGATIVE NEGATIVE mg/dL   Urobilinogen, UA 0.2 0.0 - 1.0 mg/dL   Nitrite NEGATIVE NEGATIVE   Leukocytes, UA NEGATIVE NEGATIVE  Urine rapid drug screen (hosp performed)  Result Value Ref Range   Opiates NONE DETECTED NONE DETECTED   Cocaine NONE DETECTED NONE DETECTED   Benzodiazepines NONE DETECTED NONE DETECTED   Amphetamines NONE DETECTED NONE DETECTED   Tetrahydrocannabinol POSITIVE (A) NONE DETECTED   Barbiturates NONE DETECTED NONE DETECTED  I-stat troponin, ED (not at [facility])  Result Value Ref Range   Troponin i, poc 0.00 0.00 - 0.08 ng/mL   Comment 3            Labs Review Labs Reviewed  COMPREHENSIVE METABOLIC PANEL - Abnormal; Notable for the following:    Creatinine, Ser 1.40 (*)    AST 47 (*)    GFR calc non Af Amer 59 (*)    GFR calc Af Amer 68 (*)    All other components within normal limits  URINE RAPID DRUG SCREEN (HOSP PERFORMED) - Abnormal; Notable for the following:    Tetrahydrocannabinol POSITIVE (*)    All other components within normal  limits  TROPONIN I  CBC WITH DIFFERENTIAL/PLATELET  URINALYSIS, ROUTINE W REFLEX MICROSCOPIC  I-STAT TROPOININ, ED    Imaging Review Dg Chest Port 1 View  04/24/2014   CLINICAL DATA:  Dizziness.  EXAM: PORTABLE CHEST - 1 VIEW  COMPARISON:  December 25, 2013.  FINDINGS: The heart size and mediastinal contours are within normal limits. Both lungs are clear. No pneumothorax or pleural effusion is noted. The visualized skeletal structures are unremarkable.  IMPRESSION: No acute cardiopulmonary abnormality seen.   Electronically Signed   By: Roque LiasJames  Green M.D.   On: 04/24/2014 09:17     EKG Interpretation   Date/Time:  Wednesday April 24 2014 09:02:11 EST Ventricular Rate:  73 PR Interval:  161 QRS Duration: 79 QT Interval:  377 QTC Calculation: 415 R Axis:   78 Text Interpretation:  Sinus rhythm Confirmed by ZAVITZ  MD, JOSHUA ([id number])  on 04/24/2014 11:21:13 AM      10:19 AM Patient seen and assessed by attending physician, Dr. Abran DukeJ. Zavitz. As per physician, cancelled CT head. Reported that this is mainly blood pressure issue. Recommended Norvasc 10 mg and that patient can be discharged home.   MDM   Final diagnoses:  Dizziness  Secondary hypertension, unspecified  Medical non-compliance    Medications  amLODipine (NORVASC) tablet 5 mg (5 mg Oral Given 04/24/14 1106)    Filed Vitals:   04/24/14 1030 04/24/14 1045 04/24/14 1100 04/24/14 1128  BP: 171/112 183/104 187/105 182/113  Pulse: 67 62 67 61  Temp:      TempSrc:      Resp: 17 19 16 14   Height:      Weight:      SpO2: 100% 100% 99% 100%   EKG noted normal sinus rhythm with a heart rate of 73 bpm. Troponin negative elevation. CBC negative elevated leukocytosis. Hemoglobin 13.9, hematocrit 46.1. CMP noted mildly elevated creatinine of 1.40-no baseline creatinine to compare to. Urinalysis unremarkable-negative findings of hemoglobin, nitrites, leukocytes. Urine drug screen unremarkable-positive for cannabis. Chest x-ray  no acute cardiopulmonary disease noted. Patient seen and assessed by attending physician, Dr. Jodi Mourning. As per physician, recommended CT head without contrast to be discontinued-that this is mainly a high blood pressure issue. Recommended patient to be started on amlodipine daily - recommended first dose to be given in the ED setting. Labs unremarkable-negative findings of end organ damage. Creatinine appears to be mildly elevated at 1.40-there is no other labs in the past to compare to for baseline. Negative focal neurological deficits. GCS 15. Doubt stroke. Doubt ICH/SAH. Doubt hypertensive urgency/emergency/crisis. Suspicion of dizziness secondary to poor blood pressure control. Patient stable, afebrile. Patient not septic appearing. Discharged patient. Discharged patient with Norvasc daily-as per attending physician's orders. Referred patient to primary care provider to be seen and assessed. Discussed with patient that he'll need to get blood pressure rechecked within approximately 48 hours. Discussed with patient that creatinine is to be reassessed as well. Discussed with patient to rest and stay hydrated. Discussed with patient DASH diet. Discussed with patient to closely monitor symptoms and if symptoms are to worsen or change to report back to the ED - strict return instructions given.  Patient agreed to plan of care, understood, all questions answered.   Raymon Mutton, PA-C 04/24/14 1130  Enid Skeens, MD 04/24/14 1550

## 2014-09-29 IMAGING — CR DG CHEST 2V
2 series · 2 of 2 positions shown · non-contrast
Comparison: PA and lateral chest x-ray dated February 25, 2006.

CLINICAL DATA: Cough and dizziness and error pain ; history of
tobacco use

EXAM:
CHEST  2 VIEW

[w chest pa]
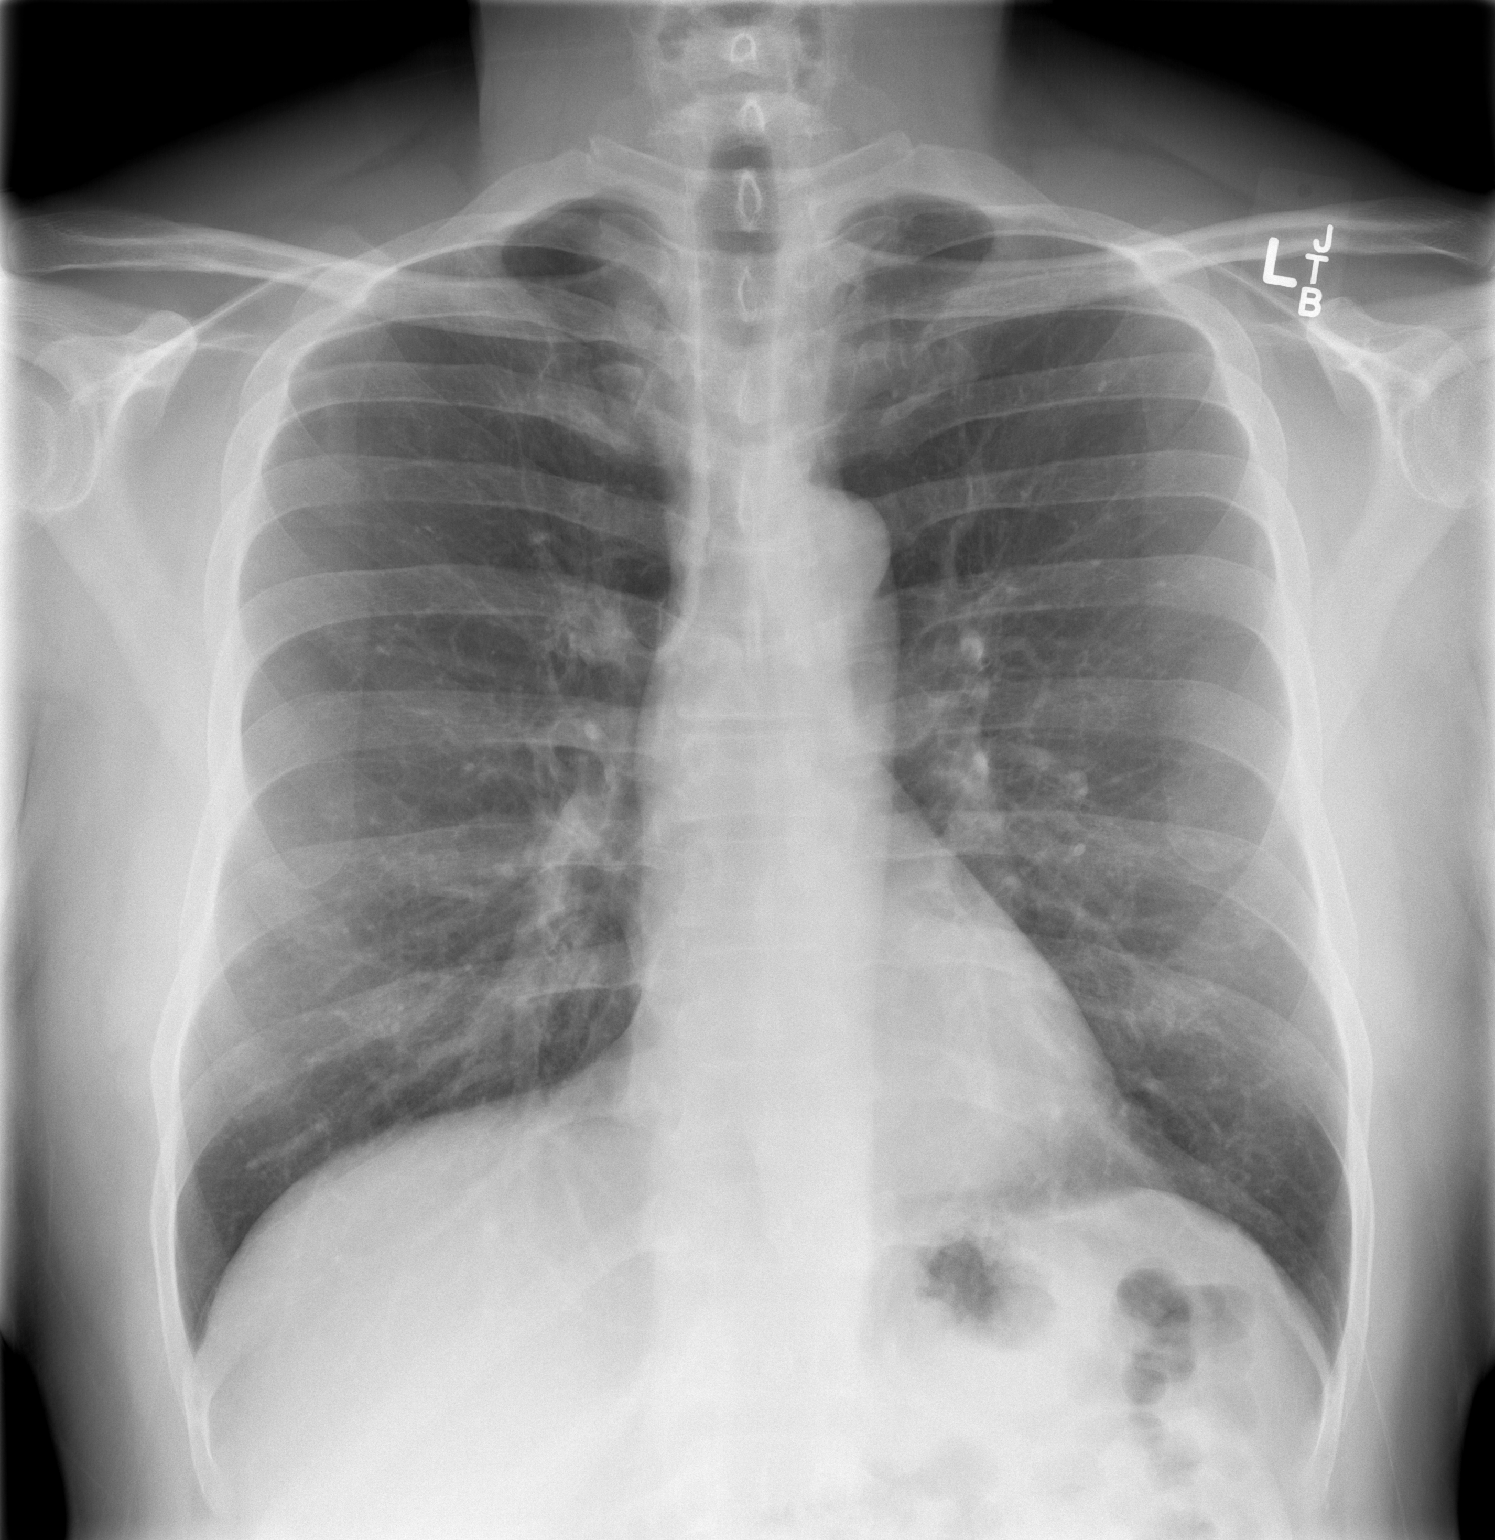

[w chest lat]
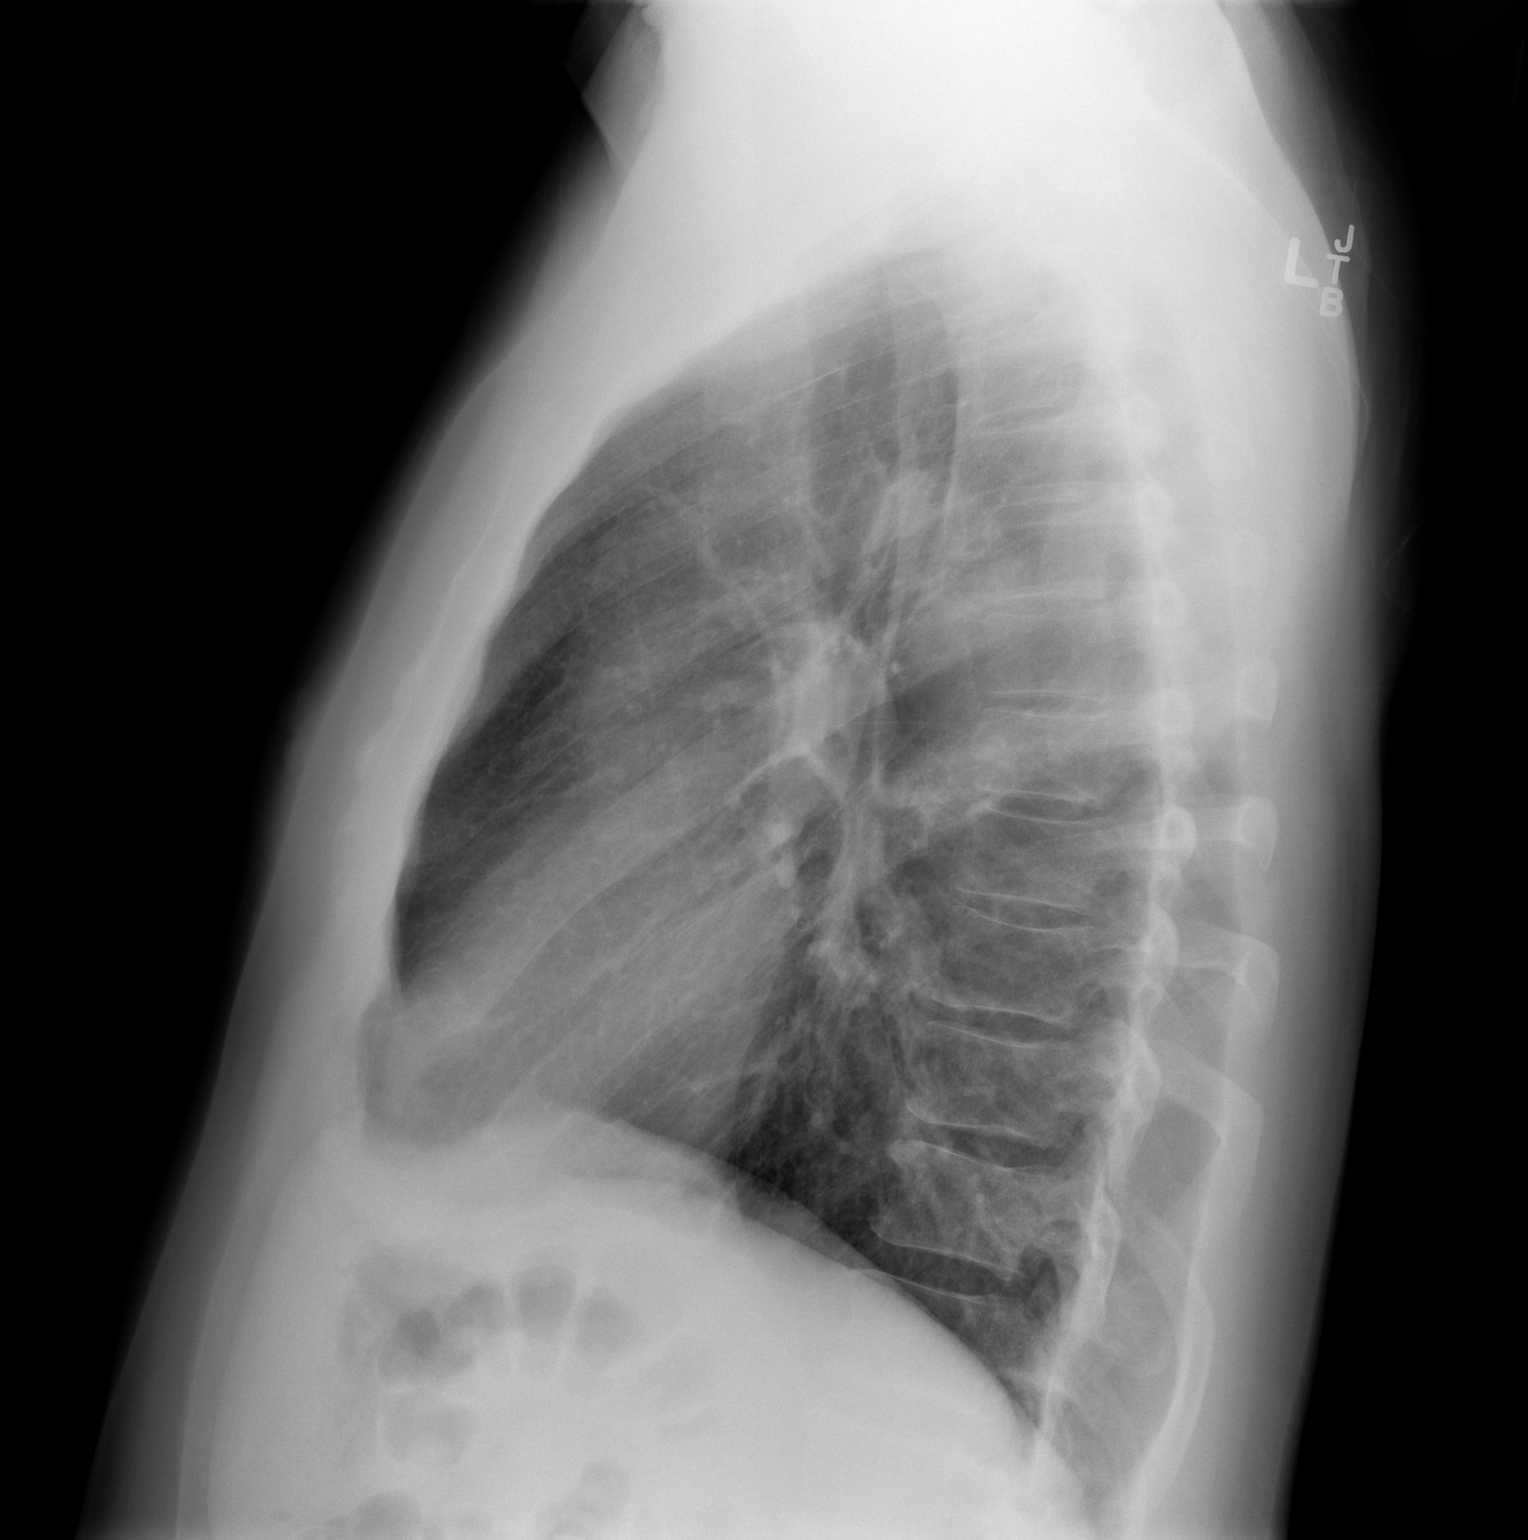

[2 of 2 positions shown; findings below may reference images not displayed]

FINDINGS: The lungs are well-expanded. There is no focal infiltrate. The heart
and pulmonary vascularity are within the limits of normal. There is
no pleural effusion. The mediastinum is normal in width. The bony
thorax is unremarkable.
IMPRESSION: There is no evidence of pneumonia nor CHF nor other acute
cardiopulmonary abnormality.

## 2015-10-24 DIAGNOSIS — I10 Essential (primary) hypertension: Secondary | ICD-10-CM | POA: Diagnosis present

## 2019-07-27 ENCOUNTER — Ambulatory Visit: Payer: Self-pay | Admitting: Cardiology

## 2020-10-21 DIAGNOSIS — H6123 Impacted cerumen, bilateral: Secondary | ICD-10-CM | POA: Insufficient documentation

## 2022-01-05 ENCOUNTER — Other Ambulatory Visit: Payer: Self-pay | Admitting: Family Medicine

## 2022-01-05 ENCOUNTER — Ambulatory Visit
Admission: RE | Admit: 2022-01-05 | Discharge: 2022-01-05 | Disposition: A | Discharge status: Home / Self Care | Payer: PRIVATE HEALTH INSURANCE | Source: Ambulatory Visit | Attending: Family Medicine | Admitting: Family Medicine

## 2022-01-05 DIAGNOSIS — R059 Cough, unspecified: Secondary | ICD-10-CM

## 2022-01-05 DIAGNOSIS — F17200 Nicotine dependence, unspecified, uncomplicated: Secondary | ICD-10-CM

## 2022-03-09 ENCOUNTER — Emergency Department (HOSPITAL_COMMUNITY): Payer: PRIVATE HEALTH INSURANCE

## 2022-03-09 ENCOUNTER — Other Ambulatory Visit: Payer: Self-pay

## 2022-03-09 ENCOUNTER — Emergency Department (HOSPITAL_COMMUNITY)
Admission: EM | Admit: 2022-03-09 | Discharge: 2022-03-09 | Disposition: A | Discharge status: Home / Self Care | Payer: PRIVATE HEALTH INSURANCE | Attending: Emergency Medicine | Admitting: Emergency Medicine

## 2022-03-09 DIAGNOSIS — Z79899 Other long term (current) drug therapy: Secondary | ICD-10-CM | POA: Insufficient documentation

## 2022-03-09 DIAGNOSIS — M109 Gout, unspecified: Secondary | ICD-10-CM | POA: Diagnosis not present

## 2022-03-09 DIAGNOSIS — I10 Essential (primary) hypertension: Secondary | ICD-10-CM | POA: Insufficient documentation

## 2022-03-09 DIAGNOSIS — M79641 Pain in right hand: Secondary | ICD-10-CM | POA: Diagnosis present

## 2022-03-09 MED ORDER — DEXAMETHASONE SODIUM PHOSPHATE 10 MG/ML IJ SOLN
10.0000 mg | Freq: Once | INTRAMUSCULAR | Status: AC
  Administered 2022-03-09: 10 mg via INTRAMUSCULAR
  Filled 2022-03-09: qty 1

## 2022-03-09 NOTE — Discharge Instructions (Signed)
Thank you for letting us take care of you today.  You were treated for an acute gout flare of your right hand with a dose of steroids. I recommend you take over the counter medications to help with any continued pain and follow-up closely with your PCP for better management of your blood pressure, possible gout prevention, and management of any future gout flare-ups.  If you develop fever, chest pain, shortness of breath, or other concerns, please return to ED for re-evaluation.

## 2022-03-09 NOTE — ED Triage Notes (Signed)
Pt reports right hand swelling x 2 weeks. Pt reports hx of gout. Denies injury to the hand.

## 2022-03-09 NOTE — ED Provider Notes (Addendum)
[facility]-EMERGENCY DEPT Provider Note   CSN: [id number] Arrival date & time: 03/09/22  1054     History  Chief Complaint  Patient presents with   Hand Pain    John Barron is a 56 y.o. male with PMH HTN who presents to ED c/o intermittent right hand swelling and pain for the last 2 months. States it briefly got better but never resolved and started worsening again about 2 weeks ago. Has a history of gout attacks. Admits to high sodium intake, diet high in red meat, and alcohol use that is often associated with flares. Has not taken any medication at home for symptoms. Denies injury or trauma to hand, fever, chills, chest pain, shortness of breath, or other complaints. States symptoms the same as previous gout attack. 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 Denies history of kidney disease or diabetes. Did not take his anti-hypertensive this morning but has medication available.       Home Medications Prior to Admission medications   Medication Sig Start Date End Date Taking? Authorizing Provider  amLODipine (NORVASC) 5 MG tablet Take 1 tablet (5 mg total) by mouth daily. 04/24/14   Sci[place], Ashok Cordia, PA-C      Allergies    Patient has no known allergies.    Review of Systems   Review of Systems  Constitutional:  Negative for activity change, appetite change, chills and fever.  HENT:  Negative for congestion, ear pain and sore throat.   Eyes:  Negative for pain and visual disturbance.  Respiratory:  Negative for cough, chest tightness and shortness of breath.   Cardiovascular:  Negative for chest pain, palpitations and leg swelling.  Gastrointestinal:  Negative for abdominal pain, constipation, diarrhea, nausea and vomiting.  Genitourinary:  Negative for decreased urine volume, difficulty urinating, dysuria and hematuria.   Musculoskeletal:  Positive for joint swelling. Negative for back pain.  Skin:  Negative for color change and rash.  Neurological:  Negative for dizziness, seizures, syncope, weakness and headaches.  All other systems reviewed and are negative.   Physical Exam Updated Vital Signs BP (!) 171/115   Pulse 88   Temp 98.4 F (36.9 C) (Oral)   Resp 16   SpO2 100%  Physical Exam Vitals and nursing note reviewed.  Constitutional:      General: He is not in acute distress.    Appearance: Normal appearance. He is not toxic-appearing.  HENT:     Head: Normocephalic and atraumatic.     Mouth/Throat:     Mouth: Mucous membranes are moist.  Eyes:     Conjunctiva/sclera: Conjunctivae normal.  Cardiovascular:     Rate and Rhythm: Normal rate and regular rhythm.     Heart sounds: No murmur heard. Pulmonary:     Effort: Pulmonary effort is normal. No respiratory distress.     Breath sounds: Normal breath sounds. No wheezing, rhonchi or rales.  Abdominal:     General: Abdomen is flat.     Palpations: Abdomen is soft.     Tenderness: There is no abdominal tenderness.  Musculoskeletal:     Cervical back: Neck supple.     Comments: Diffuse swelling to right hand with overlying increased warmth, mild erythema, and significant tenderness to light palpation, radial pulse 2+, normal sensation, range of motion limited secondary to  swelling and pain  Skin:    General: Skin is warm and dry.     Capillary Refill: Capillary refill takes less than 2 seconds.  Neurological:     Mental Status: He is alert. Mental status is at baseline.  Psychiatric:        Behavior: Behavior normal.     ED Results / Procedures / Treatments   Labs (all labs ordered are listed, but only abnormal results are displayed) Labs Reviewed - No data to display  EKG None  Radiology DG Hand Complete Right  Result Date: 03/09/2022 CLINICAL DATA:  Trauma, right hand swelling for 2 weeks, history of gout, greatest pain  at middle finger EXAM: RIGHT HAND - COMPLETE 3+ VIEW COMPARISON:  None Available. FINDINGS: Osseous mineralization normal. Joint spaces preserved. No acute fracture, dislocation, or bone destruction. Soft tissue swelling dorsally overlying the MCP joints. No erosive changes. IMPRESSION: No acute osseous abnormalities. Electronically Signed   By: Ulyses Southward M.D.   On: 03/09/2022 11:28    Procedures None  Medications Ordered in ED Medications  dexamethasone (DECADRON) injection 10 mg (10 mg Intramuscular Given 03/09/22 1145)    ED Course/ Medical Decision Making/ A&P                           Medical Decision Making Amount and/or Complexity of Data Reviewed Radiology: ordered. Decision-making details documented in ED Course.  Risk Prescription drug management.   56 year old male with PMH HTN presents to ED with right hand pain, swelling, erythema, and warmth consistent with a monoarticular arthritis most likely gout flare. Pt with history of same he states presents similarly but has not been evaluated for it by his primary care provider. No reported trauma and x-ray negative for fracture. Neurovascularly intact, afebrile, does not appear acutely ill, has had no insect bite or known wound to hand, and has no extension of erythema or warmth past hand so do not suspect an acute cellulitis or septic joint. Review of previous labs showed slightly elevated creatinine in 2016 so dose of Decadron given in ED to treat acute gout flare and pt instructed to treat continued pain with over the counter medication upon discharge with strict instruction given to follow-up with PCP for chronic management of likely recurrent gout, better blood pressure control, and recheck of his kidney function. Provided with blood pressure log and will take his home anti-hypertensive after he laves. Given strict return precautions for fever, shortness of breath, spreading erythema or warmth, worsening of symptoms, or other  concerns. Lifestyle counseling including diet modification and alcohol avoidance provided. All questions answered. Discussed case with attending MD who agreed with plan. Pt understood and happy with treatment plan.          Final Clinical Impression(s) / ED Diagnoses Final diagnoses:  Acute gout of right hand, unspecified cause    Rx / DC Orders ED Discharge Orders     None         Tonette Lederer, PA-C 03/09/22 1205    Tonette Lederer, PA-C 03/09/22 1206    Maia Plan, MD 03/12/22 [phone]

## 2022-08-04 ENCOUNTER — Emergency Department (HOSPITAL_COMMUNITY)
Admission: EM | Admit: 2022-08-04 | Discharge: 2022-08-04 | Discharge status: Against Medical Advice | Payer: PRIVATE HEALTH INSURANCE | Attending: Emergency Medicine | Admitting: Emergency Medicine

## 2022-08-04 ENCOUNTER — Encounter (HOSPITAL_COMMUNITY): Payer: Self-pay

## 2022-08-04 ENCOUNTER — Other Ambulatory Visit: Payer: Self-pay

## 2022-08-04 DIAGNOSIS — N1832 Chronic kidney disease, stage 3b: Secondary | ICD-10-CM

## 2022-08-04 DIAGNOSIS — K625 Hemorrhage of anus and rectum: Secondary | ICD-10-CM

## 2022-08-04 DIAGNOSIS — I10 Essential (primary) hypertension: Secondary | ICD-10-CM | POA: Diagnosis present

## 2022-08-04 DIAGNOSIS — Z79899 Other long term (current) drug therapy: Secondary | ICD-10-CM | POA: Insufficient documentation

## 2022-08-04 DIAGNOSIS — N179 Acute kidney failure, unspecified: Secondary | ICD-10-CM | POA: Insufficient documentation

## 2022-08-04 LAB — CBC WITH DIFFERENTIAL/PLATELET
Abs Immature Granulocytes: 0.06 10*3/uL (ref 0.00–0.07)
Basophils Absolute: 0.1 10*3/uL (ref 0.0–0.1)
Basophils Relative: 1 %
Eosinophils Absolute: 0.1 10*3/uL (ref 0.0–0.5)
Eosinophils Relative: 1 %
HCT: 39.3 % (ref 39.0–52.0)
Hemoglobin: 13.1 g/dL (ref 13.0–17.0)
Immature Granulocytes: 1 %
Lymphocytes Relative: 25 %
Lymphs Abs: 2.7 10*3/uL (ref 0.7–4.0)
MCH: 31.6 pg (ref 26.0–34.0)
MCHC: 33.3 g/dL (ref 30.0–36.0)
MCV: 94.7 fL (ref 80.0–100.0)
Monocytes Absolute: 1 10*3/uL (ref 0.1–1.0)
Monocytes Relative: 9 %
Neutro Abs: 6.8 10*3/uL (ref 1.7–7.7)
Neutrophils Relative %: 63 %
Platelets: 358 10*3/uL (ref 150–400)
RBC: 4.15 MIL/uL — ABNORMAL LOW (ref 4.22–5.81)
RDW: 13.4 % (ref 11.5–15.5)
WBC: 10.6 10*3/uL — ABNORMAL HIGH (ref 4.0–10.5)
nRBC: 0 % (ref 0.0–0.2)

## 2022-08-04 LAB — COMPREHENSIVE METABOLIC PANEL
ALT: 63 U/L — ABNORMAL HIGH (ref 0–44)
AST: 70 U/L — ABNORMAL HIGH (ref 15–41)
Albumin: 3.4 g/dL — ABNORMAL LOW (ref 3.5–5.0)
Alkaline Phosphatase: 88 U/L (ref 38–126)
Anion gap: 8 (ref 5–15)
BUN: 25 mg/dL — ABNORMAL HIGH (ref 6–20)
CO2: 20 mmol/L — ABNORMAL LOW (ref 22–32)
Calcium: 8.2 mg/dL — ABNORMAL LOW (ref 8.9–10.3)
Chloride: 110 mmol/L (ref 98–111)
Creatinine, Ser: 1.98 mg/dL — ABNORMAL HIGH (ref 0.61–1.24)
GFR, Estimated: 39 mL/min — ABNORMAL LOW (ref >60)
Glucose, Bld: 119 mg/dL — ABNORMAL HIGH (ref 70–99)
Potassium: 3.7 mmol/L (ref 3.5–5.1)
Sodium: 138 mmol/L (ref 135–145)
Total Bilirubin: 0.7 mg/dL (ref 0.3–1.2)
Total Protein: 7 g/dL (ref 6.5–8.1)

## 2022-08-04 LAB — URINALYSIS, ROUTINE W REFLEX MICROSCOPIC
Bacteria, UA: NONE SEEN
Bilirubin Urine: NEGATIVE
Glucose, UA: NEGATIVE mg/dL
Hgb urine dipstick: NEGATIVE
Ketones, ur: NEGATIVE mg/dL
Leukocytes,Ua: NEGATIVE
Nitrite: NEGATIVE
Protein, ur: 100 mg/dL — AB
Specific Gravity, Urine: 1.021 (ref 1.005–1.030)
pH: 5 (ref 5.0–8.0)

## 2022-08-04 LAB — I-STAT CHEM 8, ED
BUN: 24 mg/dL — ABNORMAL HIGH (ref 6–20)
Calcium, Ion: 1.13 mmol/L — ABNORMAL LOW (ref 1.15–1.40)
Chloride: 109 mmol/L (ref 98–111)
Creatinine, Ser: 2.2 mg/dL — ABNORMAL HIGH (ref 0.61–1.24)
Glucose, Bld: 113 mg/dL — ABNORMAL HIGH (ref 70–99)
HCT: 41 % (ref 39.0–52.0)
Hemoglobin: 13.9 g/dL (ref 13.0–17.0)
Potassium: 3.8 mmol/L (ref 3.5–5.1)
Sodium: 142 mmol/L (ref 135–145)
TCO2: 21 mmol/L — ABNORMAL LOW (ref 22–32)

## 2022-08-04 LAB — POC OCCULT BLOOD, ED: Fecal Occult Bld: POSITIVE — AB

## 2022-08-04 LAB — LIPASE, BLOOD: Lipase: 59 U/L — ABNORMAL HIGH (ref 11–51)

## 2022-08-04 MED ORDER — LACTATED RINGERS IV BOLUS
1000.0000 mL | Freq: Once | INTRAVENOUS | Status: AC
  Administered 2022-08-04: 1000 mL via INTRAVENOUS

## 2022-08-04 NOTE — ED Triage Notes (Addendum)
Patient arrives with wife at bedside. Complains of rectal bleeding for 2 days. Denies abdominal pain, N/V. States bowel movements have been normal. Denies black stool. Also states he has noticed a rash on his tongue. No blood thinners. No history or hemorrhoids.

## 2022-08-04 NOTE — Subjective & Objective (Signed)
CC: rectal bleeding HPI: 57 year old African-American male history of hypertension,'s chronic kidney disease, presents the ER today with a 2-day history of rectal bleeding.  He has had 7 episodes of rectal bleeding.  Some are mixed with stool.  They have been all bright red blood.  Denies any pain with bleeding.  He is never had a colonoscopy.  He sees primary care very rarely.  He denies any chest pain, shortness of breath or syncope. On arrival temp 98.7 heart rate 113 blood pressure 161/115.  Repeat vital signs show heart rate of 95, blood pressure 188/124 satting 98% on room air.  White count 10.6, hemoglobin 13.1, platelet 358  Sodium 138, potassium 3.7, bicarb 20, BUN 25, creatinine 1.98, glucose 119, calcium 8.2, Weedman 3.4, AST of 70, ALT 63, alk phos 88, total bili 0.7  Lipase of 59 Hemoccult was positive  UA negative except for 100 protein.  Discussed with the patient that he requires admission the hospital for serial hemoglobin testing and for GI consultation with probable colonoscopy.  Patient declines hospital admission.  Patient was advised of possibility of worsening clinical condition if he leaves AMA.  His wife was present.  Patient states that he will come back tomorrow for repeat CBC.  Patient understands that he will not see a GI consultant or get a colonoscopy if he is not admitted tonight.  EDP updated that the patient wants to leave AMA.

## 2022-08-04 NOTE — Consult Note (Signed)
Hospitalist Consultation History and Physical    John Barron:[id number] DOB: 1965-07-31 DOA: 08/04/2022  DOS: the patient was seen and examined on 08/04/2022  PCP: Patient, No Pcp Per   Patient coming from: Home  I have personally briefly reviewed patient's old medical records in [place] [facility]  CC: rectal bleeding HPI: 57 year old African-American male history of hypertension,'s chronic kidney disease, presents the ER today with a 2-day history of rectal bleeding.  He has had 7 episodes of rectal bleeding.  Some are mixed with stool.  They have been all bright red blood.  Denies any pain with bleeding.  He is never had a colonoscopy.  He sees primary care very rarely.  He denies any chest pain, shortness of breath or syncope. On arrival temp 98.7 heart rate 113 blood pressure 161/115.  Repeat vital signs show heart rate of 95, blood pressure 188/124 satting 98% on room air.  White count 10.6, hemoglobin 13.1, platelet 358  Sodium 138, potassium 3.7, bicarb 20, BUN 25, creatinine 1.98, glucose 119, calcium 8.2, Weedman 3.4, AST of 70, ALT 63, alk phos 88, total bili 0.7  Lipase of 59 Hemoccult was positive  UA negative except for 100 protein.  Discussed with the patient that he requires admission the hospital for serial hemoglobin testing and for GI consultation with probable colonoscopy.  Patient declines hospital admission.  Patient was advised of possibility of worsening clinical condition if he leaves AMA.  His wife was present.  Patient states that he will come back tomorrow for repeat CBC.  Patient understands that he will not see a GI consultant or get a colonoscopy if he is not admitted tonight.  EDP updated that the patient wants to leave AMA.    ED Course: hemoccult positive. HgB 13.1, Scr 1.98  Review of Systems:  Review of Systems  Constitutional:  Negative for malaise/fatigue.  HENT: Negative.    Eyes: Negative.   Respiratory: Negative.    Cardiovascular:   Negative for chest pain.       No syncope  Gastrointestinal:  Positive for blood in stool. Negative for abdominal pain, nausea and vomiting.  Genitourinary: Negative.   Musculoskeletal: Negative.   Skin: Negative.   Neurological: Negative.   Endo/Heme/Allergies: Negative.   Psychiatric/Behavioral: Negative.    All other systems reviewed and are negative.   Past Medical History:  Diagnosis Date   Hypertension     History reviewed. No pertinent surgical history.   reports that he has been smoking. He does not have any smokeless tobacco history on file. He reports current alcohol use. He reports current drug use. Drug: Marijuana.  No Known Allergies  History reviewed. No pertinent family history.  Prior to Admission medications   Medication Sig Start Date End Date Taking? Authorizing Provider  acetaminophen (TYLENOL) 500 MG tablet Take 500 mg by mouth every 6 (six) hours as needed for headache.   Yes [provider]  amLODipine-olmesartan (AZOR) 10-40 MG tablet Take 1 tablet by mouth daily. 10/02/20  Yes [provider]  Multiple Vitamin (MULTIVITAMIN WITH MINERALS) TABS tablet Take 1 tablet by mouth 2 (two) times a week.   Yes [provider]  amLODipine (NORVASC) 5 MG tablet Take 1 tablet (5 mg total) by mouth daily. Patient not taking: Reported on 08/04/2022 04/24/14   Raymon Mutton, PA-C    Physical Exam: Vitals:   08/04/22 1935 08/04/22 2015 08/04/22 2030 08/04/22 2139  BP: (!) 161/115 (!) 183/115 (!) 172/113 (!) 188/124  Pulse: Marland Kitchen)  113 99 97 95  Resp: 17  18 18   Temp: 98.7 F (37.1 C)     TempSrc: Oral     SpO2: 100% 99% 100% 98%  Weight: 102.1 kg     Height: 5\' 7"  (1.702 m)       Physical Exam Vitals and nursing note reviewed.  Constitutional:      General: He is not in acute distress.    Appearance: Normal appearance. He is not ill-appearing, toxic-appearing or diaphoretic.  HENT:     Head: Normocephalic and atraumatic.  Eyes:      General: No scleral icterus. Abdominal:     General: There is no distension.  Neurological:     General: No focal deficit present.     Mental Status: He is alert and oriented to person, place, and time.      Labs on Admission: I have personally reviewed following labs and imaging studies  CBC: Recent Labs  Lab 08/04/22 1950 08/04/22 2003  WBC 10.6*  --   NEUTROABS 6.8  --   HGB 13.1 13.9  HCT 39.3 41.0  MCV 94.7  --   PLT 358  --    Basic Metabolic Panel: Recent Labs  Lab 08/04/22 1950 08/04/22 2003  NA 138 142  K 3.7 3.8  CL 110 109  CO2 20*  --   GLUCOSE 119* 113*  BUN 25* 24*  CREATININE 1.98* 2.20*  CALCIUM 8.2*  --    GFR: Estimated Creatinine Clearance: 43.2 mL/min (A) (by C-G formula based on SCr of 2.2 mg/dL (H)). Liver Function Tests: Recent Labs  Lab 08/04/22 1950  AST 70*  ALT 63*  ALKPHOS 88  BILITOT 0.7  PROT 7.0  ALBUMIN 3.4*   Recent Labs  Lab 08/04/22 1950  LIPASE 59*   Urine analysis:    Component Value Date/Time   COLORURINE YELLOW 08/04/2022 1934   APPEARANCEUR CLEAR 08/04/2022 1934   LABSPEC 1.021 08/04/2022 1934   PHURINE 5.0 08/04/2022 1934   GLUCOSEU NEGATIVE 08/04/2022 1934   HGBUR NEGATIVE 08/04/2022 1934   BILIRUBINUR NEGATIVE 08/04/2022 1934   KETONESUR NEGATIVE 08/04/2022 1934   PROTEINUR 100 (A) 08/04/2022 1934   UROBILINOGEN 0.2 04/24/2014 0923   NITRITE NEGATIVE 08/04/2022 1934   LEUKOCYTESUR NEGATIVE 08/04/2022 1934    Radiological Exams on Admission: I have personally reviewed images No results found.  EKG: My personal interpretation of EKG shows: no EKG to review  Assessment/Plan Principal Problem:   Rectal bleeding Active Problems:   Essential hypertension   CKD stage 3b, GFR 30-44 ml/min (HCC)   Assessment and Plan: * Rectal bleeding 7 painless bloody BM today. Pt declines hospital admission. Pt understands risk of leaving AMA. Pt wants phone number to [facility] to self schedule colonoscopy. Pt  states he will come back to ER for repeat CBC in AM. Pt aware that he will not GI consult or get colonoscopy if he leaves AMA. EDP notified.  CKD stage 3b, GFR 30-44 ml/min (HCC) 6 years ago, pt had CKD. Pt states he has been months since he last took any BP meds. Likely he has CKD now from hypertension.  Essential hypertension Non-compliant with BP meds.   Family Communication: discussed with pt and his wife John Barron Disposition Plan: pt leaving AMA    Carollee Herter, DO [facility] 08/04/2022, 10:47 PM

## 2022-08-04 NOTE — Assessment & Plan Note (Signed)
7 painless bloody BM today. Pt declines hospital admission. Pt understands risk of leaving AMA. Pt wants phone number to [facility] to self schedule colonoscopy. Pt states he will come back to ER for repeat CBC in AM. Pt aware that he will not GI consult or get colonoscopy if he leaves AMA. EDP notified.

## 2022-08-04 NOTE — ED Notes (Signed)
  Patient asked to leave AMA.  Risks of leaving AMA were explained to patient and wife.  Patient stated he understood risks and would come back another day.    Signature pad not working in room.  Unable to sign AMA form.

## 2022-08-04 NOTE — Discharge Instructions (Signed)
Thank you for coming to [facility] Emergency Department. You were seen for rectal bleeding. We did an exam, labs, and imaging, and these showed a kidney injury and rectal bleeding.  We recommended that you stay to be admitted however you elected to leave AGAINST MEDICAL ADVICE.  I recommended that you stay and be evaluated by gastroenterology to receive an inpatient colonoscopy.  You elected to follow-up with your outpatient gastroenterologist instead.  You face significant morbidity and mortality up to and including death a result of leaving here today. You expressed understanding of this and wanted to leave.   Do not hesitate to return to the ED. Please come back at your earliest convenience. Please do not hesitate to return or call 911 if you experience: -Worsening symptoms -Abdominal pain -Nausea/vomiting -Lightheadedness, passing out -Fevers/chills -Anything else that concerns you

## 2022-08-04 NOTE — Assessment & Plan Note (Signed)
Noncompliant with BP meds 

## 2022-08-04 NOTE — Assessment & Plan Note (Signed)
6 years ago, pt had CKD. Pt states he has been months since he last took any BP meds. Likely he has CKD now from hypertension.

## 2022-08-04 NOTE — ED Provider Notes (Signed)
[place] EMERGENCY DEPARTMENT AT [facility] Provider Note   CSN: [id number] Arrival date & time: 08/04/22  1924     History  Chief Complaint  Patient presents with   Rectal Bleeding    John Barron is a 57 y.o. male with HTN not on medicine presents with rectal bleeding.   Patient arrives with wife at bedside. Had rectal bleeding starting yesterday, had it with some formed stool mixed in. Had some bright red and dark red blood. Total of 7-8 episodes of rectal bleeding since yesterday. No h/o similar. No abdominal or rectal pain, nausea/vomiting, fevers/chills, CP, SOB, lightheadedness, syncope, urinary symptoms. Never has had a colonoscopy. Never had surgery on abdomen. Doesn't take a blood thinner.    HPI     Home Medications Prior to Admission medications   Medication Sig Start Date End Date Taking? Authorizing Provider  acetaminophen (TYLENOL) 500 MG tablet Take 500 mg by mouth every 6 (six) hours as needed for headache.   Yes [provider]  amLODipine-olmesartan (AZOR) 10-40 MG tablet Take 1 tablet by mouth daily. 10/02/20  Yes [provider]  Multiple Vitamin (MULTIVITAMIN WITH MINERALS) TABS tablet Take 1 tablet by mouth 2 (two) times a week.   Yes [provider]  amLODipine (NORVASC) 5 MG tablet Take 1 tablet (5 mg total) by mouth daily. Patient not taking: Reported on 08/04/2022 04/24/14   Raymon Mutton, PA-C      Allergies    Patient has no known allergies.    Review of Systems   Review of Systems Review of systems Negative for f/c.  A 10 point review of systems was performed and is negative unless otherwise reported in HPI.  Physical Exam Updated Vital Signs BP (!) 188/124 (BP Location: Right Arm)   Pulse 95   Temp 98.7 F (37.1 C) (Oral)   Resp 18   Ht 5\' 7"  (1.702 m)   Wt 102.1 kg   SpO2 98%   BMI 35.24 kg/m  Physical Exam General: Normal appearing male, lying in bed.  HEENT: Sclera anicteric, MMM, trachea  midline.  Cardiology: RRR, no murmurs/rubs/gallops. BL radial and DP pulses equal bilaterally.  Resp: Normal respiratory rate and effort. CTAB, no wheezes, rhonchi, crackles.  Abd: Soft, non-tender, non-distended. No rebound tenderness or guarding.  GU: No abnormalities of anus/rectum. No external hemorrhoids observed, no internal hemorrhoids palpated. Very small amount BRB on glove. No pain with palpation. MSK: No peripheral edema or signs of trauma. Extremities without deformity or TTP. No cyanosis or clubbing. Skin: warm, dry. Neuro: A&Ox4, CNs II-XII grossly intact. MAEs. Sensation grossly intact.  Psych: Normal mood and affect.   ED Results / Procedures / Treatments   Labs (all labs ordered are listed, but only abnormal results are displayed) Labs Reviewed  CBC WITH DIFFERENTIAL/PLATELET - Abnormal; Notable for the following components:      Result Value   WBC 10.6 (*)    RBC 4.15 (*)    All other components within normal limits  COMPREHENSIVE METABOLIC PANEL - Abnormal; Notable for the following components:   CO2 20 (*)    Glucose, Bld 119 (*)    BUN 25 (*)    Creatinine, Ser 1.98 (*)    Calcium 8.2 (*)    Albumin 3.4 (*)    AST 70 (*)    ALT 63 (*)    GFR, Estimated 39 (*)    All other components within normal limits  LIPASE, BLOOD - Abnormal; Notable for  the following components:   Lipase 59 (*)    All other components within normal limits  URINALYSIS, ROUTINE W REFLEX MICROSCOPIC - Abnormal; Notable for the following components:   Protein, ur 100 (*)    All other components within normal limits  I-STAT CHEM 8, ED - Abnormal; Notable for the following components:   BUN 24 (*)    Creatinine, Ser 2.20 (*)    Glucose, Bld 113 (*)    Calcium, Ion 1.13 (*)    TCO2 21 (*)    All other components within normal limits  POC OCCULT BLOOD, ED - Abnormal; Notable for the following components:   Fecal Occult Bld POSITIVE (*)    All other components within normal limits  IRON  AND TIBC  HEMOGLOBIN AND HEMATOCRIT, BLOOD  HEMOGLOBIN AND HEMATOCRIT, BLOOD    EKG None  Radiology No results found.  Procedures Procedures    Medications Ordered in ED Medications  lactated ringers bolus 1,000 mL (0 mLs Intravenous Stopped 08/04/22 2122)    ED Course/ Medical Decision Making/ A&P                          Medical Decision Making Amount and/or Complexity of Data Reviewed Labs: ordered. Decision-making details documented in ED Course.    This patient presents to the ED for concern of painless rectal bleeding; this involves an extensive number of treatment options, and is a complaint that carries with it a high risk of complications and morbidity.  I considered the following differential and admission for this acute, potentially life threatening condition. Patient is mildly tachycardic, otherwise hypertensive and well-appearing.  MDM:    Based on clinical presentation, hematochezia reported and clinical exam history and findings, most concerned about LGIB. DDx for LGIB includes but is not limited to:  No external hemorrhoids on exam but could have bleeding painless internal hemorrhoids. No anal fissure, Lower c/f diverticulitis/diverticulosis, ischemic colitis, IBD given no abdominal pain, N/V, or abd TTP. Consider AVMs or Dieulafoy lesions or malignancy as well. No h/o colonoscopy.  Patient is mildly tachycardic on exam. Will initiate resuscitation with IVF while awaiting Hgb.    Clinical Course as of 08/04/22 2246  Wed Aug 04, 2022  2027 Hemoglobin: 13.1 Drop from 15 but last value was 8 years ago [HN]  2038 Creatinine(!): 1.98 Last value was 1.4 from 8 years ago, consider AKI [HN]  2038 ALT(!): 63 [HN]  2038 AST(!): 70 Mildly elevated [HN]  2038 BUN(!): 25 [HN]  2038 Fecal Occult Blood, POC(!): POSITIVE [HN]  2159 Admitted to Dr. Imogene Burn hospitalist. Messaged Dr. Marca Ancona w/ GI as well. [HN]  2242 Patient admitted to hospitalist w/ GI following. When Dr.  Imogene Burn went to discuss with patient, he had decided he wanted to leave against medical advice and follow up with GI as outpatient.  Patient expressed understanding of the risks of the morbidity mortality of leaving AGAINST MEDICAL ADVICE up to and including death. Instructed to follow up with GI and return to ED at earliest convenience. [HN]    Clinical Course User Index [HN] Loetta Rough, MD    Labs: I Ordered, and personally interpreted labs.  The pertinent results include:  those listed above  Additional history obtained from wife at bedside, chart review.   Cardiac Monitoring: The patient was maintained on a cardiac monitor.  I personally viewed and interpreted the cardiac monitored which showed an underlying rhythm of: sinus tachycardia  Reevaluation: After  the interventions noted above, I reevaluated the patient and found that they have :improved  Social Determinants of Health: Patient lives independently   Disposition:  AMA  Co morbidities that complicate the patient evaluation  Past Medical History:  Diagnosis Date   Hypertension      Medicines Meds ordered this encounter  Medications   lactated ringers bolus 1,000 mL    I have reviewed the patients home medicines and have made adjustments as needed  Problem List / ED Course: Problem List Items Addressed This Visit       Digestive   * (Principal) Rectal bleeding - Primary    7 painless bloody BM today. Pt declines hospital admission. Pt understands risk of leaving AMA. Pt wants phone number to [facility] to self schedule colonoscopy. Pt states he will come back to ER for repeat CBC in AM. Pt aware that he will not GI consult or get colonoscopy if he leaves AMA. EDP notified.      Other Visit Diagnoses     AKI (acute kidney injury) (HCC)                       This note was created using dictation software, which may contain spelling or grammatical errors.    Loetta Rough, MD 08/04/22  [phone]

## 2022-08-05 ENCOUNTER — Other Ambulatory Visit: Payer: Self-pay

## 2022-08-05 ENCOUNTER — Emergency Department (HOSPITAL_COMMUNITY)
Admission: EM | Admit: 2022-08-05 | Discharge: 2022-08-05 | Disposition: A | Discharge status: Home / Self Care | Payer: PRIVATE HEALTH INSURANCE | Attending: Student in an Organized Health Care Education/Training Program | Admitting: Student in an Organized Health Care Education/Training Program

## 2022-08-05 DIAGNOSIS — K625 Hemorrhage of anus and rectum: Secondary | ICD-10-CM | POA: Insufficient documentation

## 2022-08-05 LAB — CBC WITH DIFFERENTIAL/PLATELET
Abs Immature Granulocytes: 0.07 10*3/uL (ref 0.00–0.07)
Basophils Absolute: 0 10*3/uL (ref 0.0–0.1)
Basophils Relative: 0 %
Eosinophils Absolute: 0.1 10*3/uL (ref 0.0–0.5)
Eosinophils Relative: 1 %
HCT: 32.3 % — ABNORMAL LOW (ref 39.0–52.0)
Hemoglobin: 10.5 g/dL — ABNORMAL LOW (ref 13.0–17.0)
Immature Granulocytes: 1 %
Lymphocytes Relative: 20 %
Lymphs Abs: 2.2 10*3/uL (ref 0.7–4.0)
MCH: 31.2 pg (ref 26.0–34.0)
MCHC: 32.5 g/dL (ref 30.0–36.0)
MCV: 95.8 fL (ref 80.0–100.0)
Monocytes Absolute: 0.6 10*3/uL (ref 0.1–1.0)
Monocytes Relative: 6 %
Neutro Abs: 8.1 10*3/uL — ABNORMAL HIGH (ref 1.7–7.7)
Neutrophils Relative %: 72 %
Platelets: 288 10*3/uL (ref 150–400)
RBC: 3.37 MIL/uL — ABNORMAL LOW (ref 4.22–5.81)
RDW: 13.4 % (ref 11.5–15.5)
WBC: 11 10*3/uL — ABNORMAL HIGH (ref 4.0–10.5)
nRBC: 0 % (ref 0.0–0.2)

## 2022-08-05 LAB — BASIC METABOLIC PANEL
Anion gap: 10 (ref 5–15)
BUN: 19 mg/dL (ref 6–20)
CO2: 23 mmol/L (ref 22–32)
Calcium: 8.2 mg/dL — ABNORMAL LOW (ref 8.9–10.3)
Chloride: 106 mmol/L (ref 98–111)
Creatinine, Ser: 1.83 mg/dL — ABNORMAL HIGH (ref 0.61–1.24)
GFR, Estimated: 43 mL/min — ABNORMAL LOW (ref >60)
Glucose, Bld: 183 mg/dL — ABNORMAL HIGH (ref 70–99)
Potassium: 3.3 mmol/L — ABNORMAL LOW (ref 3.5–5.1)
Sodium: 139 mmol/L (ref 135–145)

## 2022-08-05 NOTE — Discharge Instructions (Signed)
Your lab work today is reassuring.  You did have a drop in your hemoglobin likely because of the amount of rectal bleeding that you had yesterday.  Since you are no longer have any rectal bleeding I am okay with outpatient follow-up.  If you begin experiencing heavy rectal bleeding again and you are starting to feel dizzy, generally weak, or are passing out you need to return to the emergency department immediately.  Your kidney function has improved in comparison to yesterday.  I have also given you follow-up with a GI doctor.  Please call to schedule an appointment.

## 2022-08-05 NOTE — ED Triage Notes (Signed)
Pt was seen last night for rectal bleeding, left am. States is here for repeat blood work, still having rectal bleeding.

## 2022-08-05 NOTE — ED Provider Notes (Signed)
[place] EMERGENCY DEPARTMENT AT [facility] Provider Note   CSN: [id number] Arrival date & time: 08/05/22  1401     History Chief Complaint  Patient presents with   Rectal Bleeding    John Barron is a 57 y.o. male patient who presents to the emergency department today for further evaluation of rectal bleeding.  Patient was seen and evaluated here yesterday for similar symptoms.  Per chart review, looks like the he was supposed to be admitted and spoke with the hospitalist but ultimately left AGAINST MEDICAL ADVICE.  He was instructed based on the chart review to come back today for repeat CBC.  Patient states that he is overall feeling very well and had 1 episode of rectal bleeding this morning but has not had any since then.  He is having no abdominal pain, generalized weakness, dizziness, or syncope.   Rectal Bleeding      Home Medications Prior to Admission medications   Medication Sig Start Date End Date Taking? Authorizing Provider  acetaminophen (TYLENOL) 500 MG tablet Take 500 mg by mouth every 6 (six) hours as needed for headache.    [provider]  amLODipine (NORVASC) 5 MG tablet Take 1 tablet (5 mg total) by mouth daily. Patient not taking: Reported on 08/04/2022 04/24/14   Sciacca, Ashok Cordia, PA-C  amLODipine-olmesartan (AZOR) 10-40 MG tablet Take 1 tablet by mouth daily. 10/02/20   [provider]  Multiple Vitamin (MULTIVITAMIN WITH MINERALS) TABS tablet Take 1 tablet by mouth 2 (two) times a week.    [provider]      Allergies    Patient has no known allergies.    Review of Systems   Review of Systems  Gastrointestinal:  Positive for hematochezia.  All other systems reviewed and are negative.   Physical Exam Updated Vital Signs BP (!) 166/94 (BP Location: Right Arm)   Pulse (!) 105   Temp 99.1 F (37.3 C) (Oral)   Resp 18   Ht 5\' 7"  (1.702 m)   Wt 102 kg   SpO2 100%   BMI 35.22 kg/m  Physical Exam Vitals and  nursing note reviewed.  Constitutional:      General: He is not in acute distress.    Appearance: Normal appearance.  HENT:     Head: Normocephalic and atraumatic.  Eyes:     General:        Right eye: No discharge.        Left eye: No discharge.  Cardiovascular:     Comments: Regular rate and rhythm.  S1/S2 are distinct without any evidence of murmur, rubs, or gallops.  Radial pulses are 2+ bilaterally.  Dorsalis pedis pulses are 2+ bilaterally.  No evidence of pedal edema. Pulmonary:     Comments: Clear to auscultation bilaterally.  Normal effort.  No respiratory distress.  No evidence of wheezes, rales, or rhonchi heard throughout. Abdominal:     General: Abdomen is flat. Bowel sounds are normal. There is no distension.     Tenderness: There is no abdominal tenderness. There is no guarding or rebound.  Musculoskeletal:        General: Normal range of motion.     Cervical back: Neck supple.  Skin:    General: Skin is warm and dry.     Findings: No rash.  Neurological:     General: No focal deficit present.     Mental Status: He is alert.  Psychiatric:  Mood and Affect: Mood normal.        Behavior: Behavior normal.     ED Results / Procedures / Treatments   Labs (all labs ordered are listed, but only abnormal results are displayed) Labs Reviewed  CBC WITH DIFFERENTIAL/PLATELET - Abnormal; Notable for the following components:      Result Value   WBC 11.0 (*)    RBC 3.37 (*)    Hemoglobin 10.5 (*)    HCT 32.3 (*)    Neutro Abs 8.1 (*)    All other components within normal limits  BASIC METABOLIC PANEL - Abnormal; Notable for the following components:   Potassium 3.3 (*)    Glucose, Bld 183 (*)    Creatinine, Ser 1.83 (*)    Calcium 8.2 (*)    GFR, Estimated 43 (*)    All other components within normal limits    EKG None  Radiology No results found.  Procedures Procedures    Medications Ordered in ED Medications - No data to display  ED Course/  Medical Decision Making/ A&P Clinical Course as of 08/05/22 1543  Thu Aug 05, 2022  1538 CBC with Differential(!) Slight drop in hemoglobin.  Approximately 3 g since yesterday's blood draw.  He is rectal bleeding has stopped and patient is overall feeling well. [CF]  1539 Basic metabolic panel(!) Mild hypokalemia.  Creatinine is elevated but improved from yesterday. [CF]    Clinical Course User Index [CF] Teressa Lower, PA-C   {   Click here for ABCD2, HEART and other calculators  Medical Decision Making John Barron is a 57 y.o. male patient who presents to the ED with rectal bleeding.  I will repeat some basic labs per Dr. Nicky Pugh note from yesterday.  If all is normal I will plan to discharge.  Hemoglobin did drop does not surprising considering the patient had 7-8 episodes of rectal bleeding yesterday.  He is overall well-appearing and hemodynamically stable.  He is not having any more rectal bleeding so I suspect that the hemoglobin will improve in the coming days.  His kidney function has improved in comparison to yesterday.  I will have him follow-up with GI for a colonoscopy.  He will call and schedule an appointment.  Strict return precautions were discussed.  He is safe for discharge at this time.   Amount and/or Complexity of Data Reviewed Labs: ordered. Decision-making details documented in ED Course.    Final Clinical Impression(s) / ED Diagnoses Final diagnoses:  Rectal bleeding    Rx / DC Orders ED Discharge Orders     None         Jolyn Lent 08/05/22 1543    Lowther, Amy, DO 08/05/22 1647

## 2023-05-24 ENCOUNTER — Other Ambulatory Visit: Payer: Self-pay | Admitting: Surgery

## 2023-06-15 ENCOUNTER — Encounter (HOSPITAL_BASED_OUTPATIENT_CLINIC_OR_DEPARTMENT_OTHER): Payer: Self-pay | Admitting: Surgery

## 2023-06-15 ENCOUNTER — Other Ambulatory Visit: Payer: Self-pay

## 2023-06-21 NOTE — H&P (Signed)
 REFERRING PHYSICIAN: Karie Chimera, MD PROVIDER: Wayne Both, MD MRN: [id number] DOB: 05-26-1965  Subjective   Chief Complaint: New Consultation (Inguinal and Umbilical Hernias)  History of Present Illness: John Barron is a 59 y.o. male who is seenas an office consultation for evaluation of New Consultation (Inguinal and Umbilical Hernias)  This is a 58 year old gentleman referred here for evaluation of a symptomatic inguinal hernia. For several months he has noticed a reducible bulge in the right inguinal area. Recently has started to cause mild to moderate discomfort which she describes as a mild ache. He reports that the hernia will still reduce. He has had no obstructive symptoms. He reports some discomfort toward his midline going toward the umbilicus when he coughs but has not noticed any bulge at the umbilicus or in the inguinal area. He does smoke 3 to 4 cigarettes daily. He has no known cardiopulmonary issues.   Review of Systems: A complete review of systems was obtained from the patient. I have reviewed this information and discussed as appropriate with the patient. See HPI as well for other ROS.  ROS   Medical History: Past Medical History:  Diagnosis Date  Hypertension   There is no problem list on file for this patient.  History reviewed. No pertinent surgical history.   No Known Allergies  Current Outpatient Medications on File Prior to Visit  Medication Sig Dispense Refill  amLODIPine-valsartan (EXFORGE) 5-160 mg tablet Take 1 tablet by mouth once daily   No current facility-administered medications on file prior to visit.   History reviewed. No pertinent family history.   Social History   Tobacco Use  Smoking Status Every Day  Types: Cigarettes  Smokeless Tobacco Not on file    Social History   Socioeconomic History  Marital status: Married  Tobacco Use  Smoking status: Every Day  Types: Cigarettes  Vaping Use  Vaping status: Unknown   Substance and Sexual Activity  Alcohol use: Yes  Drug use: Yes  Comment: marijuana   Social Drivers of Health   Housing Stability: Unknown (05/24/2023)  Housing Stability Vital Sign  Homeless in the Last Year: No   Objective:   Vitals:   BP: (!) 171/106  Pulse: 61  Temp: 36.3 C (97.3 F)  SpO2: 98%  Weight: 95.4 kg (210 lb 6.4 oz)  Height: 167.6 cm (5\' 6" )  PainSc: 6   Body mass index is 33.96 kg/m.  Physical Exam   He appears well on physical examination  Abdomen is soft and obese. He has a moderate to large right inguinal hernia which is easily reducible. I cannot feel any fascial defect at his umbilicus. I cannot produce a bulge on the left side. The inguinal floor may be a little weak but again there is no obvious hernia on the left  Labs, Imaging and Diagnostic Testing:  I have reviewed his notes in the electronic medical records Assessment and Plan:   Diagnoses and all orders for this visit:  Right inguinal hernia   The patient does have a right inguinal hernia which is easily reducible and symptomatic. I explained abdominal wall anatomy and hernias to the patient and his wife. We discussed surgical repair of hernias when they become symptomatic or large. We discussed risks of incarceration and bowel strangulation without hernia surgery. I next explained both the laparoscopic and open approaches to hernia repair as well as use of mesh. After long discussion we have decided to proceed with an open right inguinal hernia repair  with mesh with hopefully an LMA and a tap block. We discussed the risk of surgery which includes but is not limited to bleeding, infection, injury to surrounding structures, the use of mesh, nerve entrapment, chronic pain, hernia recurrence, cardiopulmonary issues with anesthesia, postoperative recovery, etc. He understands and wishes to proceed with surgery which will be scheduled

## 2023-06-22 ENCOUNTER — Other Ambulatory Visit: Payer: Self-pay

## 2023-06-22 ENCOUNTER — Encounter (HOSPITAL_BASED_OUTPATIENT_CLINIC_OR_DEPARTMENT_OTHER): Payer: Self-pay | Admitting: Surgery

## 2023-06-22 ENCOUNTER — Ambulatory Visit (HOSPITAL_BASED_OUTPATIENT_CLINIC_OR_DEPARTMENT_OTHER): Payer: Self-pay | Admitting: Anesthesiology

## 2023-06-22 ENCOUNTER — Encounter (HOSPITAL_BASED_OUTPATIENT_CLINIC_OR_DEPARTMENT_OTHER)
Admission: RE | Disposition: A | Discharge status: Home / Self Care | Payer: Self-pay | Source: Home / Self Care | Attending: Surgery

## 2023-06-22 ENCOUNTER — Ambulatory Visit (HOSPITAL_BASED_OUTPATIENT_CLINIC_OR_DEPARTMENT_OTHER)
Admission: RE | Admit: 2023-06-22 | Discharge: 2023-06-22 | Disposition: A | Discharge status: Home / Self Care | Attending: Surgery | Admitting: Surgery

## 2023-06-22 DIAGNOSIS — F17210 Nicotine dependence, cigarettes, uncomplicated: Secondary | ICD-10-CM | POA: Insufficient documentation

## 2023-06-22 DIAGNOSIS — I10 Essential (primary) hypertension: Secondary | ICD-10-CM | POA: Insufficient documentation

## 2023-06-22 DIAGNOSIS — Z79899 Other long term (current) drug therapy: Secondary | ICD-10-CM | POA: Diagnosis not present

## 2023-06-22 DIAGNOSIS — K4090 Unilateral inguinal hernia, without obstruction or gangrene, not specified as recurrent: Secondary | ICD-10-CM | POA: Insufficient documentation

## 2023-06-22 DIAGNOSIS — N1832 Chronic kidney disease, stage 3b: Secondary | ICD-10-CM

## 2023-06-22 HISTORY — DX: Unilateral inguinal hernia, without obstruction or gangrene, not specified as recurrent: K40.90

## 2023-06-22 HISTORY — PX: INGUINAL HERNIA REPAIR: SHX194

## 2023-06-22 LAB — BASIC METABOLIC PANEL
Anion gap: 13 (ref 5–15)
BUN: 20 mg/dL (ref 6–20)
CO2: 21 mmol/L — ABNORMAL LOW (ref 22–32)
Calcium: 8.8 mg/dL — ABNORMAL LOW (ref 8.9–10.3)
Chloride: 106 mmol/L (ref 98–111)
Creatinine, Ser: 1.42 mg/dL — ABNORMAL HIGH (ref 0.61–1.24)
GFR, Estimated: 58 mL/min — ABNORMAL LOW (ref >60)
Glucose, Bld: 96 mg/dL (ref 70–99)
Potassium: 3.5 mmol/L (ref 3.5–5.1)
Sodium: 140 mmol/L (ref 135–145)

## 2023-06-22 SURGERY — REPAIR, HERNIA, INGUINAL, ADULT
Anesthesia: Regional | Site: Groin | Laterality: Right

## 2023-06-22 MED ORDER — CEFAZOLIN SODIUM-DEXTROSE 2-4 GM/100ML-% IV SOLN
2.0000 g | INTRAVENOUS | Status: AC
  Administered 2023-06-22: 2 g via INTRAVENOUS

## 2023-06-22 MED ORDER — ONDANSETRON HCL 4 MG/2ML IJ SOLN
INTRAMUSCULAR | Status: DC | PRN
  Administered 2023-06-22: 4 mg via INTRAVENOUS

## 2023-06-22 MED ORDER — CEFAZOLIN SODIUM-DEXTROSE 2-4 GM/100ML-% IV SOLN
INTRAVENOUS | Status: AC
  Filled 2023-06-22: qty 100

## 2023-06-22 MED ORDER — OXYCODONE HCL 5 MG PO TABS: 5.0000 mg | ORAL_TABLET | Freq: Once | ORAL | Status: DC | PRN

## 2023-06-22 MED ORDER — KETOROLAC TROMETHAMINE 30 MG/ML IJ SOLN: 30.0000 mg | Freq: Once | INTRAMUSCULAR | Status: DC | PRN

## 2023-06-22 MED ORDER — FENTANYL CITRATE (PF) 100 MCG/2ML IJ SOLN: 25.0000 ug | INTRAMUSCULAR | Status: DC | PRN

## 2023-06-22 MED ORDER — PROPOFOL 10 MG/ML IV BOLUS
INTRAVENOUS | Status: DC | PRN
  Administered 2023-06-22: 200 mg via INTRAVENOUS

## 2023-06-22 MED ORDER — OXYCODONE HCL 5 MG/5ML PO SOLN: 5.0000 mg | Freq: Once | ORAL | Status: DC | PRN

## 2023-06-22 MED ORDER — FENTANYL CITRATE (PF) 100 MCG/2ML IJ SOLN
INTRAMUSCULAR | Status: AC
  Filled 2023-06-22: qty 2

## 2023-06-22 MED ORDER — FENTANYL CITRATE (PF) 100 MCG/2ML IJ SOLN
100.0000 ug | Freq: Once | INTRAMUSCULAR | Status: AC
  Administered 2023-06-22: 100 ug via INTRAVENOUS

## 2023-06-22 MED ORDER — HYDRALAZINE HCL 20 MG/ML IJ SOLN
INTRAMUSCULAR | Status: AC
Start: 2023-06-22 — End: ?
  Filled 2023-06-22: qty 1

## 2023-06-22 MED ORDER — MIDAZOLAM HCL 2 MG/2ML IJ SOLN
INTRAMUSCULAR | Status: AC
  Filled 2023-06-22: qty 2

## 2023-06-22 MED ORDER — BUPIVACAINE-EPINEPHRINE 0.5% -1:200000 IJ SOLN
INTRAMUSCULAR | Status: DC | PRN
  Administered 2023-06-22: 10 mL

## 2023-06-22 MED ORDER — ACETAMINOPHEN 500 MG PO TABS
1000.0000 mg | ORAL_TABLET | ORAL | Status: AC
  Administered 2023-06-22: 1000 mg via ORAL

## 2023-06-22 MED ORDER — AMISULPRIDE (ANTIEMETIC) 5 MG/2ML IV SOLN: 10.0000 mg | Freq: Once | INTRAVENOUS | Status: DC | PRN

## 2023-06-22 MED ORDER — PROPOFOL 10 MG/ML IV BOLUS
INTRAVENOUS | Status: AC
  Filled 2023-06-22: qty 20

## 2023-06-22 MED ORDER — ONDANSETRON HCL 4 MG/2ML IJ SOLN
INTRAMUSCULAR | Status: AC
  Filled 2023-06-22: qty 2

## 2023-06-22 MED ORDER — DEXAMETHASONE SODIUM PHOSPHATE 10 MG/ML IJ SOLN
INTRAMUSCULAR | Status: DC | PRN
  Administered 2023-06-22: 10 mg via INTRAVENOUS

## 2023-06-22 MED ORDER — LIDOCAINE 2% (20 MG/ML) 5 ML SYRINGE
INTRAMUSCULAR | Status: DC | PRN
  Administered 2023-06-22: 40 mg via INTRAVENOUS

## 2023-06-22 MED ORDER — BUPIVACAINE-EPINEPHRINE (PF) 0.5% -1:200000 IJ SOLN
INTRAMUSCULAR | Status: AC
  Filled 2023-06-22: qty 30

## 2023-06-22 MED ORDER — CHLORHEXIDINE GLUCONATE CLOTH 2 % EX PADS: 6.0000 | MEDICATED_PAD | Freq: Once | CUTANEOUS | Status: DC

## 2023-06-22 MED ORDER — FENTANYL CITRATE (PF) 100 MCG/2ML IJ SOLN
INTRAMUSCULAR | Status: DC | PRN
  Administered 2023-06-22: 25 ug via INTRAVENOUS
  Administered 2023-06-22: 50 ug via INTRAVENOUS
  Administered 2023-06-22: 25 ug via INTRAVENOUS
  Administered 2023-06-22: 50 ug via INTRAVENOUS

## 2023-06-22 MED ORDER — HYDRALAZINE HCL 20 MG/ML IJ SOLN
5.0000 mg | Freq: Once | INTRAMUSCULAR | Status: AC
  Administered 2023-06-22: 5 mg via INTRAVENOUS

## 2023-06-22 MED ORDER — ACETAMINOPHEN 500 MG PO TABS
ORAL_TABLET | ORAL | Status: AC
  Filled 2023-06-22: qty 2

## 2023-06-22 MED ORDER — LACTATED RINGERS IV SOLN: INTRAVENOUS | Status: DC

## 2023-06-22 MED ORDER — ROPIVACAINE HCL 5 MG/ML IJ SOLN
INTRAMUSCULAR | Status: DC | PRN
Start: 2023-06-22 — End: 2023-06-22
  Administered 2023-06-22: 30 mL via PERINEURAL

## 2023-06-22 MED ORDER — MIDAZOLAM HCL 2 MG/2ML IJ SOLN
2.0000 mg | Freq: Once | INTRAMUSCULAR | Status: AC
  Administered 2023-06-22: 2 mg via INTRAVENOUS

## 2023-06-22 MED ORDER — CHLORHEXIDINE GLUCONATE CLOTH 2 % EX PADS
6.0000 | MEDICATED_PAD | Freq: Once | CUTANEOUS | Status: AC
  Administered 2023-06-22: 6 via TOPICAL

## 2023-06-22 MED ORDER — ENSURE PRE-SURGERY PO LIQD
296.0000 mL | Freq: Once | ORAL | Status: DC
Start: 2023-06-23 — End: 2023-06-22

## 2023-06-22 MED ORDER — OXYCODONE HCL 5 MG PO TABS: 5.0000 mg | ORAL_TABLET | Freq: Four times a day (QID) | ORAL | 0 refills | Status: AC | PRN

## 2023-06-22 SURGICAL SUPPLY — 34 items
BLADE CLIPPER SURG (BLADE) ×1 IMPLANT
BLADE SURG 15 STRL LF DISP TIS (BLADE) ×1 IMPLANT
CANISTER SUCT 1200ML W/VALVE (MISCELLANEOUS) IMPLANT
CHLORAPREP W/TINT 26 (MISCELLANEOUS) ×1 IMPLANT
COVER BACK TABLE 60X90IN (DRAPES) ×1 IMPLANT
COVER MAYO STAND STRL (DRAPES) ×1 IMPLANT
DERMABOND ADVANCED .7 DNX12 (GAUZE/BANDAGES/DRESSINGS) ×1 IMPLANT
DRAIN PENROSE .5X12 LATEX STL (DRAIN) ×1 IMPLANT
DRAPE LAPAROTOMY 100X72 PEDS (DRAPES) ×1 IMPLANT
DRAPE UTILITY XL STRL (DRAPES) ×1 IMPLANT
ELECT REM PT RETURN 9FT ADLT (ELECTROSURGICAL) ×1 IMPLANT
ELECTRODE REM PT RTRN 9FT ADLT (ELECTROSURGICAL) ×1 IMPLANT
GLOVE SURG SIGNA 7.5 PF LTX (GLOVE) ×1 IMPLANT
GOWN STRL REUS W/ TWL LRG LVL3 (GOWN DISPOSABLE) ×1 IMPLANT
GOWN STRL REUS W/ TWL XL LVL3 (GOWN DISPOSABLE) ×1 IMPLANT
MESH PARIETEX PROGRIP RIGHT (Mesh General) IMPLANT
NDL HYPO 25X1 1.5 SAFETY (NEEDLE) ×1 IMPLANT
NEEDLE HYPO 25X1 1.5 SAFETY (NEEDLE) ×1 IMPLANT
NS IRRIG 1000ML POUR BTL (IV SOLUTION) IMPLANT
PACK BASIN DAY SURGERY FS (CUSTOM PROCEDURE TRAY) ×1 IMPLANT
PENCIL SMOKE EVACUATOR (MISCELLANEOUS) ×1 IMPLANT
SLEEVE SCD COMPRESS KNEE MED (STOCKING) ×1 IMPLANT
SPIKE FLUID TRANSFER (MISCELLANEOUS) IMPLANT
SPONGE INTESTINAL PEANUT (DISPOSABLE) IMPLANT
SPONGE T-LAP 4X18 ~~LOC~~+RFID (SPONGE) ×1 IMPLANT
SUT MNCRL AB 4-0 PS2 18 (SUTURE) ×1 IMPLANT
SUT SILK 2 0 SH (SUTURE) IMPLANT
SUT VIC AB 2-0 CT1 TAPERPNT 27 (SUTURE) ×2 IMPLANT
SUT VIC AB 3-0 CT1 27XBRD (SUTURE) ×1 IMPLANT
SYR BULB EAR ULCER 3OZ GRN STR (SYRINGE) IMPLANT
SYR CONTROL 10ML LL (SYRINGE) ×1 IMPLANT
TOWEL GREEN STERILE FF (TOWEL DISPOSABLE) ×1 IMPLANT
TUBE CONNECTING 20X1/4 (TUBING) IMPLANT
YANKAUER SUCT BULB TIP NO VENT (SUCTIONS) IMPLANT

## 2023-06-22 NOTE — Transfer of Care (Signed)
 Immediate Anesthesia Transfer of Care Note  Patient: John Barron  Procedure(s) Performed: REPAIR, HERNIA, INGUINAL, ADULT (Right: Groin)  Patient Location: PACU  Anesthesia Type:GA combined with regional for post-op pain  Level of Consciousness: drowsy and patient cooperative  Airway & Oxygen Therapy: Patient Spontanous Breathing and Patient connected to face mask oxygen  Post-op Assessment: Report given to RN and Post -op Vital signs reviewed and stable  Post vital signs: Reviewed and stable  Last Vitals:  Vitals Value Taken Time  BP    Temp    Pulse    Resp    SpO2      Last Pain:  Vitals:   06/22/23 0800  PainSc: 0-No pain      Patients Stated Pain Goal: 4 (06/22/23 0702)  Complications: No notable events documented.

## 2023-06-22 NOTE — Interval H&P Note (Signed)
 History and Physical Interval Note:no change in H and P  06/22/2023 8:02 AM  John Barron  has presented today for surgery, with the diagnosis of RIGHT INGUINAL HERNIA.  The various methods of treatment have been discussed with the patient and family. After consideration of risks, benefits and other options for treatment, the patient has consented to  Procedure(s) with comments: REPAIR, HERNIA, INGUINAL, ADULT (Right) - LMA TAP BLOCK as a surgical intervention.  The patient's history has been reviewed, patient examined, no change in status, stable for surgery.  I have reviewed the patient's chart and labs.  Questions were answered to the patient's satisfaction.     [name]

## 2023-06-22 NOTE — Anesthesia Postprocedure Evaluation (Signed)
 Anesthesia Post Note  Patient: John Barron  Procedure(s) Performed: REPAIR, HERNIA, INGUINAL, ADULT (Right: Groin)     Patient location during evaluation: PACU Anesthesia Type: Regional and General Level of consciousness: awake Pain management: pain level controlled Vital Signs Assessment: post-procedure vital signs reviewed and stable Respiratory status: spontaneous breathing, nonlabored ventilation and respiratory function stable Cardiovascular status: blood pressure returned to baseline and stable Postop Assessment: no apparent nausea or vomiting Anesthetic complications: no   No notable events documented.  Last Vitals:  Vitals:   06/22/23 0953 06/22/23 1020  BP: (!) 143/81 (!) 160/101  Pulse: 74 76  Resp: 17 16  Temp:  36.5 C  SpO2: 97% 98%    Last Pain:  Vitals:   06/22/23 1020  TempSrc: Temporal  PainSc: 0-No pain                 Jomarie Gellis P Dulcie Gammon

## 2023-06-22 NOTE — Progress Notes (Signed)
Assisted Dr. Ellender with right, transabdominal plane, ultrasound guided block. Side rails up, monitors on throughout procedure. See vital signs in flow sheet. Tolerated Procedure well. 

## 2023-06-22 NOTE — Discharge Instructions (Addendum)
 [facility]__[facility]  UMBILICAL OR INGUINAL HERNIA REPAIR: POST OP INSTRUCTIONS  Always review your discharge instruction sheet given to you by the facility where your surgery was performed. IF YOU HAVE DISABILITY OR FAMILY LEAVE FORMS, YOU MUST BRING THEM TO THE OFFICE FOR PROCESSING.   DO NOT GIVE THEM TO YOUR DOCTOR.  1. A  prescription for pain medication may be given to you upon discharge.  Take your pain medication as prescribed, if needed.  If narcotic pain medicine is not needed, then you may take acetaminophen (Tylenol) or ibuprofen (Advil) as needed. 2. Take your usually prescribed medications unless otherwise directed. If you need a refill on your pain medication, please contact your pharmacy.  They will contact our office to request authorization. Prescriptions will not be filled after 5 pm or on week-ends. 3. You should follow a light diet the first 24 hours after arrival home, such as soup and crackers, etc.  Be sure to include lots of fluids daily.  Resume your normal diet the day after surgery. 4.Most patients will experience some swelling and bruising around the umbilicus or in the groin and scrotum.  Ice packs and reclining will help.  Swelling and bruising can take several days to resolve.  6. It is common to experience some constipation if taking pain medication after surgery.  Increasing fluid intake and taking a stool softener (such as Colace) will usually help or prevent this problem from occurring.  A mild laxative (Milk of Magnesia or Miralax) should be taken according to package directions if there are no bowel movements after 48 hours. 7. Unless discharge instructions indicate otherwise, you may remove your bandages 24-48 hours after surgery, and you may shower at that time.  You may have steri-strips (small skin tapes) in place directly over the incision.  These strips should be left on the skin for 7-10 days.  If your surgeon used skin glue on the  incision, you may shower in 24 hours.  The glue will flake off over the next 2-3 weeks.  Any sutures or staples will be removed at the office during your follow-up visit. 8. ACTIVITIES:  You may resume regular (light) daily activities beginning the next day--such as daily self-care, walking, climbing stairs--gradually increasing activities as tolerated.  You may have sexual intercourse when it is comfortable.  Refrain from any heavy lifting or straining until approved by your doctor.  a.You may drive when you are no longer taking prescription pain medication, you can comfortably wear a seatbelt, and you can safely maneuver your car and apply brakes. b.RETURN TO WORK:   _____________________________________________  9.You should see your doctor in the office for a follow-up appointment approximately 2-3 weeks after your surgery.  Make sure that you call for this appointment within a day or two after you arrive home to insure a convenient appointment time. 10.OTHER INSTRUCTIONS: YOU MAY SHOWER STARTING TOMORROW ICE PACK AND TYLENOL ALSO FOR PAIN NO LIFTING MORE THAN 15 POUNDS FOR 4 WEEKS    _____________________________________  WHEN TO CALL YOUR DOCTOR: Fever over 101.0 Inability to urinate Nausea and/or vomiting Extreme swelling or bruising Continued bleeding from incision. Increased pain, redness, or drainage from the incision  The clinic staff is available to answer your questions during regular business hours.  Please don't hesitate to call and ask to speak to one of the nurses for clinical concerns.  If you have a medical emergency, go to the nearest emergency room or call 911.  A surgeon from [facility] is always on call at the hospital   [address], [place], [place]  [address] ?  [address] [address], [place], [place]   [address] [phone] ? [phone] ? FAX [phone] Web site: [email or web address]  No Tylenol before 1pm today.   Post  Anesthesia Home Care Instructions  Activity: Get plenty of rest for the remainder of the day. A responsible individual must stay with you for 24 hours following the procedure.  For the next 24 hours, DO NOT: -Drive a car -Advertising copywriter -Drink alcoholic beverages -Take any medication unless instructed by your physician -Make any legal decisions or sign important papers.  Meals: Start with liquid foods such as gelatin or soup. Progress to regular foods as tolerated. Avoid greasy, spicy, heavy foods. If nausea and/or vomiting occur, drink only clear liquids until the nausea and/or vomiting subsides. Call your physician if vomiting continues.  Special Instructions/Symptoms: Your throat may feel dry or sore from the anesthesia or the breathing tube placed in your throat during surgery. If this causes discomfort, gargle with warm salt water. The discomfort should disappear within 24 hours.  If you had a scopolamine patch placed behind your ear for the management of post- operative nausea and/or vomiting:  1. The medication in the patch is effective for 72 hours, after which it should be removed.  Wrap patch in a tissue and discard in the trash. Wash hands thoroughly with soap and water. 2. You may remove the patch earlier than 72 hours if you experience unpleasant side effects which may include dry mouth, dizziness or visual disturbances. 3. Avoid touching the patch. Wash your hands with soap and water after contact with the patch.

## 2023-06-22 NOTE — Anesthesia Procedure Notes (Signed)
 Procedure Name: LMA Insertion Date/Time: 06/22/2023 8:25 AM  Performed by: Thornell Mule, CRNAPre-anesthesia Checklist: Patient identified, Emergency Drugs available, Suction available and Patient being monitored Patient Re-evaluated:Patient Re-evaluated prior to induction Oxygen Delivery Method: Circle system utilized Preoxygenation: Pre-oxygenation with 100% oxygen Induction Type: IV induction LMA: LMA inserted LMA Size: 4.0 Placement Confirmation: positive ETCO2 Tube secured with: Tape Dental Injury: Teeth and Oropharynx as per pre-operative assessment

## 2023-06-22 NOTE — Anesthesia Preprocedure Evaluation (Addendum)
 Anesthesia Evaluation  Patient identified by MRN, date of birth, ID band Patient awake    Reviewed: Allergy & Precautions, NPO status , Patient's Chart, lab work & pertinent test results  Airway Mallampati: II  TM Distance: >3 FB Neck ROM: Full    Dental  (+) Partial Upper   Pulmonary Current Smoker and Patient abstained from smoking.   Pulmonary exam normal        Cardiovascular hypertension, Pt. on medications Normal cardiovascular exam     Neuro/Psych negative neurological ROS  negative psych ROS   GI/Hepatic negative GI ROS,,,(+)     substance abuse    Endo/Other  negative endocrine ROS    Renal/GU CRFRenal disease     Musculoskeletal negative musculoskeletal ROS (+)    Abdominal  (+) + obese  Peds  Hematology negative hematology ROS (+)   Anesthesia Other Findings RIGHT INGUINAL HERNIA  Reproductive/Obstetrics                             Anesthesia Physical Anesthesia Plan  ASA: 2  Anesthesia Plan: General and Regional   Post-op Pain Management: Regional block*   Induction: Intravenous  PONV Risk Score and Plan: 1 and Ondansetron, Dexamethasone, Propofol infusion, Midazolam and Treatment may vary due to age or medical condition  Airway Management Planned: LMA  Additional Equipment:   Intra-op Plan:   Post-operative Plan: Extubation in OR  Informed Consent: I have reviewed the patients History and Physical, chart, labs and discussed the procedure including the risks, benefits and alternatives for the proposed anesthesia with the patient or authorized representative who has indicated his/her understanding and acceptance.     Dental advisory given  Plan Discussed with: CRNA  Anesthesia Plan Comments:        Anesthesia Quick Evaluation

## 2023-06-22 NOTE — Op Note (Signed)
 REPAIR, HERNIA, INGUINAL, ADULT  Procedure Note  John Barron 06/22/2023   Pre-op Diagnosis: RIGHT INGUINAL HERNIA     Post-op Diagnosis: same  Procedure(s): REPAIR, HERNIA, INGUINAL, ADULT  Surgeon(s): [name], MD [place], [place], [place]  Anesthesia: General  Staff:  Circulator: Maryan Rued, RN Scrub Person: Wardell Heath, CST  Estimated Blood Loss: Minimal               Findings: The patient was found of a right sided indirect as well as indirect inguinal hernia.  This was repaired with a large piece of Prolene ProGrip mesh from [employer]  Procedure: The patient was brought to the operating identifies correct patient.  He was placed upon the operating table and general anesthesia was induced.  His right lower quadrant was prepped and draped in the usual sterile fashion.  He had already undergone a tap block by anesthesiology in the preoperative holding area.  I anesthetized the skin further in the right inguinal area with Marcaine.  I then made a longitudinal incision with a scalpel.  I dissected down through Scarpa's fascia with cautery.  I then identified the external oblique muscle and opened it toward the internal and external rings.  The testicular cord and structures were controlled with a Penrose drain.  The patient had a moderate-sized indirect hernia sac as well as a direct hernia.  I dissected the indirect hernia sac down to the base.  All contents have been reduced back into the abdominal cavity.  I tied off the base with a 2-0 silk suture and then excised the redundant sac.  I then placed another 2-0 silk suture at the internal ring.  I then imbricated the hernia sac and floor of the inguinal canal with 2 separate 2-0 silk sutures.  Next a large piece of Prolene ProGrip mesh was brought to the field.  The mesh was placed against the pubic tubercle and then brought around the cord structures and internal ring and then back to the pubic tubercle.  I then sutured the mesh  in place with 2 separate 2-0 Vicryl sutures to the pubic tubercle and transversalis fascia.  Wide coverage of the inguinal floor and internal ring appeared to be achieved.  I then closed the external oblique fascia over the top of the mesh with a running 2-0 Vicryl suture.  Scarpa's fascia was closed interrupted 3-0 Vicryl sutures and the skin was closed with running 4-0 Monocryl.  Dermabond was then applied.  The patient tolerated the procedure well.  All the counts were correct at the end of the procedure.  The patient was then extubated in the operating room and taken in a stable condition to the recovery room.          [name]   Date: 06/22/2023  Time: 8:59 AM

## 2023-06-22 NOTE — Anesthesia Procedure Notes (Signed)
 Anesthesia Regional Block: TAP block   Pre-Anesthetic Checklist: , timeout performed,  Correct Patient, Correct Site, Correct Laterality,  Correct Procedure, Correct Position, site marked,  Risks and benefits discussed,  Surgical consent,  Pre-op evaluation,  At surgeon's request and post-op pain management  Laterality: Right  Prep: chloraprep       Needles:  Injection technique: Single-shot  Needle Type: Echogenic Stimulator Needle     Needle Length: 10cm  Needle Gauge: 20     Additional Needles:   Procedures:,,,, ultrasound used (permanent image in chart),,    Narrative:  Start time: 06/22/2023 7:55 AM End time: 06/22/2023 8:05 AM Injection made incrementally with aspirations every 5 mL.  Performed by: Personally  Anesthesiologist: Leonides Grills, MD  Additional Notes: Functioning IV was confirmed and monitors were applied.  A timeout was performed. Sterile prep, hand hygiene and sterile gloves were used. A 20ga Bbraun echogenic stimulator needle was used. Negative aspiration and negative test dose prior to incremental administration of local anesthetic. The patient tolerated the procedure well.  Ultrasound guidance: relevent anatomy identified, needle position confirmed, local anesthetic spread visualized around nerve(s), vascular puncture avoided.  Image printed for medical record.

## 2023-06-23 ENCOUNTER — Encounter (HOSPITAL_BASED_OUTPATIENT_CLINIC_OR_DEPARTMENT_OTHER): Payer: Self-pay | Admitting: Surgery

## 2023-06-23 DIAGNOSIS — K4090 Unilateral inguinal hernia, without obstruction or gangrene, not specified as recurrent: Secondary | ICD-10-CM | POA: Diagnosis not present

## 2023-06-30 ENCOUNTER — Other Ambulatory Visit: Payer: Self-pay

## 2023-06-30 ENCOUNTER — Encounter (HOSPITAL_COMMUNITY): Payer: Self-pay

## 2023-06-30 ENCOUNTER — Emergency Department (HOSPITAL_COMMUNITY)
Admission: EM | Admit: 2023-06-30 | Discharge: 2023-06-30 | Disposition: A | Discharge status: Home / Self Care | Attending: Emergency Medicine | Admitting: Emergency Medicine

## 2023-06-30 DIAGNOSIS — S81812A Laceration without foreign body, left lower leg, initial encounter: Secondary | ICD-10-CM | POA: Insufficient documentation

## 2023-06-30 DIAGNOSIS — Z23 Encounter for immunization: Secondary | ICD-10-CM | POA: Diagnosis not present

## 2023-06-30 DIAGNOSIS — W268XXA Contact with other sharp object(s), not elsewhere classified, initial encounter: Secondary | ICD-10-CM | POA: Insufficient documentation

## 2023-06-30 MED ORDER — LIDOCAINE HCL (PF) 1 % IJ SOLN
10.0000 mL | Freq: Once | INTRAMUSCULAR | Status: AC
  Administered 2023-06-30: 10 mL
  Filled 2023-06-30: qty 30

## 2023-06-30 MED ORDER — CEPHALEXIN 500 MG PO CAPS: 500.0000 mg | ORAL_CAPSULE | Freq: Two times a day (BID) | ORAL | 0 refills | Status: AC

## 2023-06-30 MED ORDER — CEPHALEXIN 500 MG PO CAPS
500.0000 mg | ORAL_CAPSULE | Freq: Once | ORAL | Status: AC
  Administered 2023-06-30: 500 mg via ORAL
  Filled 2023-06-30: qty 1

## 2023-06-30 MED ORDER — TETANUS-DIPHTH-ACELL PERTUSSIS 5-2.5-18.5 LF-MCG/0.5 IM SUSY
0.5000 mL | PREFILLED_SYRINGE | Freq: Once | INTRAMUSCULAR | Status: AC
  Administered 2023-06-30: 0.5 mL via INTRAMUSCULAR
  Filled 2023-06-30: qty 0.5

## 2023-06-30 MED ORDER — TETANUS-DIPHTH-ACELL PERTUSSIS 5-2.5-18.5 LF-MCG/0.5 IM SUSY
PREFILLED_SYRINGE | INTRAMUSCULAR | Status: AC
  Filled 2023-06-30: qty 0.5
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
## 2023-06-30 NOTE — Discharge Instructions (Addendum)
 Your 15 stitches need to be removed in 10-14 days.  This can happen at your doctor's office, urgent care, or the ER.   You can bathe and shower normally.

## 2023-07-13 ENCOUNTER — Other Ambulatory Visit: Payer: Self-pay

## 2023-07-13 ENCOUNTER — Encounter (HOSPITAL_COMMUNITY): Payer: Self-pay

## 2023-07-13 ENCOUNTER — Emergency Department (HOSPITAL_COMMUNITY)
Admission: EM | Admit: 2023-07-13 | Discharge: 2023-07-13 | Disposition: A | Discharge status: Home / Self Care | Attending: Emergency Medicine | Admitting: Emergency Medicine

## 2023-07-13 DIAGNOSIS — Z4802 Encounter for removal of sutures: Secondary | ICD-10-CM | POA: Diagnosis present

## 2023-07-13 NOTE — Discharge Instructions (Addendum)
 Today you were seen for suture removal.  Please see the attached aftercare instructions.  Please return to the ED if you have any signs or symptoms concerning for infection including fever, puslike discharge, redness around your wound or warmth surrounding your wound.  Thank you for letting us  treat you today. After performing a physical exam, I feel you are safe to go home. Please follow up with your PCP in the next several days and provide them with your records from this visit. Return to the Emergency Room if pain becomes severe or symptoms worsen.

## 2023-07-13 NOTE — ED Provider Notes (Signed)
 [place] EMERGENCY DEPARTMENT AT [facility] Provider Note   CSN: [id number] Arrival date & time: 07/13/23  1159     History  Chief Complaint  Patient presents with   Suture / Staple Removal    John Barron is a 58 y.o. male presents today after sustaining a left lower extremity laceration on 06/30/2023 for suture removal.  Patient denies fever, chills, discharge from wound, or pain.  Patient states that he has completed his course of Keflex without issue.  Patient has no other complaints at this time.  HPI     Home Medications Prior to Admission medications   Medication Sig Start Date End Date Taking? Authorizing Provider  acetaminophen (TYLENOL) 500 MG tablet Take 500 mg by mouth every 6 (six) hours as needed for headache.    [provider]  amLODipine-valsartan (EXFORGE) 5-160 MG tablet Take 1 tablet by mouth daily.    [provider]  Multiple Vitamin (MULTIVITAMIN WITH MINERALS) TABS tablet Take 1 tablet by mouth 2 (two) times a week.    [provider]  oxyCODONE (OXY IR/ROXICODONE) 5 MG immediate release tablet Take 1 tablet (5 mg total) by mouth every 6 (six) hours as needed for moderate pain (pain score 4-6) or severe pain (pain score 7-10). 06/22/23   John Blumenthal, MD      Allergies    Patient has no known allergies.    Review of Systems   Review of Systems  All other systems reviewed and are negative.   Physical Exam Updated Vital Signs BP (!) 152/119   Pulse 65   Temp 98.9 F (37.2 C) (Oral)   Resp 16   Ht 5\' 7"  (1.702 m)   Wt 95.3 kg   SpO2 100%   BMI 32.89 kg/m  Physical Exam Vitals and nursing note reviewed.  Constitutional:      General: He is not in acute distress.    Appearance: Normal appearance. He is well-developed. He is not ill-appearing.  HENT:     Head: Normocephalic and atraumatic.     Right Ear: External ear normal.     Left Ear: External ear normal.     Nose: Nose normal.  Eyes:      Extraocular Movements: Extraocular movements intact.     Conjunctiva/sclera: Conjunctivae normal.  Cardiovascular:     Rate and Rhythm: Normal rate and regular rhythm.     Pulses: Normal pulses.  Pulmonary:     Effort: Pulmonary effort is normal. No respiratory distress.  Abdominal:     Palpations: Abdomen is soft.  Musculoskeletal:        General: No swelling.     Cervical back: Neck supple.  Skin:    General: Skin is warm and dry.     Capillary Refill: Capillary refill takes less than 2 seconds.     Comments: Well-healed approximately 8 cm irregular laceration on medial left lower extremity.  Patient is neurovascularly intact.  Patient has no signs or symptoms concerning for infection including purulent discharge, erythema, fever, or warmth.  Neurological:     General: No focal deficit present.     Mental Status: He is alert.  Psychiatric:        Mood and Affect: Mood normal.     ED Results / Procedures / Treatments   Labs (all labs ordered are listed, but only abnormal results are displayed) Labs Reviewed - No data to display  EKG None  Radiology No results found.  Procedures Suture Removal  Date/Time: 07/13/2023 12:36 PM  Performed by: John Charity, PA-C Authorized by: John Charity, PA-C   Consent:    Consent obtained:  Verbal   Consent given by:  Patient   Risks discussed:  Bleeding, pain and wound separation   Alternatives discussed:  No treatment Universal protocol:    Patient identity confirmed:  Verbally with patient Location:    Location:  Lower extremity   Lower extremity location:  Leg   Leg location:  L lower leg Procedure details:    Wound appearance:  No signs of infection and clean   Number of sutures removed:  15 Post-procedure details:    Post-removal:  Steri-Strips applied and dressing applied   Procedure completion:  Tolerated     Medications Ordered in ED Medications - No data to display  ED Course/ Medical Decision Making/  A&P                                 Medical Decision Making  This patient presents to the ED for concern of suture removal differential diagnosis includes: Well-healing wound, cellulitis, abscess    Patient presented today for suture removal.  Patient's wound is well-healed and has no signs or symptoms concerning for infection.  I feel patient is safe for discharge at this time.           Final Clinical Impression(s) / ED Diagnoses Final diagnoses:  Visit for suture removal    Rx / DC Orders ED Discharge Orders     None         John Barron 07/13/23 1237    John Cotta, MD 07/14/23 [id number]

## 2023-07-22 ENCOUNTER — Other Ambulatory Visit: Payer: Self-pay | Admitting: Nephrology

## 2023-07-22 DIAGNOSIS — N1831 Chronic kidney disease, stage 3a: Secondary | ICD-10-CM

## 2023-09-06 ENCOUNTER — Other Ambulatory Visit

## 2023-09-14 ENCOUNTER — Ambulatory Visit
Admission: RE | Admit: 2023-09-14 | Discharge: 2023-09-14 | Disposition: A | Discharge status: Home / Self Care | Source: Ambulatory Visit | Attending: Nephrology

## 2023-09-14 DIAGNOSIS — N1831 Chronic kidney disease, stage 3a: Secondary | ICD-10-CM

## 2023-11-07 NOTE — Progress Notes (Signed)
 John Barron MRN: [id number] DOB: Jul 03, 1965 (age: 58 y.o.)  Is in clinic today for follow up.  Right hip pain.  He is starting to get some groin radiation as well  On exam this is a well-nourished well-developed individual in no acute distress.  They are alert and oriented x3.  Mood and affect are age-appropriate.  They are cooperative with the exam.  They ambulate without difficulty or assistive device.  Head is normocephalic atraumatic.  Neck is supple without meningismus.  Cranial nerves II through XII are grossly intact without deficit.  Skin is warm dry and intact without any signs of infection.  Musculoskeletal examination of the right hip with significant greater trochanteric bursal tenderness with some radiation into the groin.  Impression/plan  No diagnosis found.  Right greater trochanteric bursa injected today.  He is getting some pain radiating to the groin.  This does not help him set up for an intra-articular hip injection.  Large joint arthrocentesis: R knee on 11/07/2023 3:20 PMIndications: pain and diagnostic evaluation Details: 21 G needle, anterolateral approach Medications: 8 mL BUPivacaine  HCl 0.25 % (2.5 mg/mL); 80 mg triamcinolone acetonide 40 mg/mL Outcome: tolerated well, no immediate complications Procedure, treatment alternatives, risks and benefits explained, specific risks discussed. Consent was given by the patient. Immediately prior to procedure a time out was called to verify the correct patient, procedure, equipment, support staff and site/side marked as required. Patient was prepped and draped in the usual sterile fashion.
